# Patient Record
Sex: Female | Born: 1970 | Hispanic: No | Marital: Married | Smoking: Never smoker
Health system: Southern US, Community
[De-identification: ages and names within clinical notes are randomized; demographics above are authoritative.]

## PROBLEM LIST (undated history)

## (undated) DIAGNOSIS — M1990 Unspecified osteoarthritis, unspecified site: Secondary | ICD-10-CM

## (undated) DIAGNOSIS — E785 Hyperlipidemia, unspecified: Secondary | ICD-10-CM

## (undated) DIAGNOSIS — E559 Vitamin D deficiency, unspecified: Secondary | ICD-10-CM

## (undated) DIAGNOSIS — B977 Papillomavirus as the cause of diseases classified elsewhere: Secondary | ICD-10-CM

## (undated) HISTORY — DX: Unspecified osteoarthritis, unspecified site: M19.90

## (undated) HISTORY — PX: MOUTH SURGERY: SHX715

## (undated) HISTORY — DX: Hyperlipidemia, unspecified: E78.5

## (undated) HISTORY — DX: Vitamin D deficiency, unspecified: E55.9

## (undated) HISTORY — DX: Papillomavirus as the cause of diseases classified elsewhere: B97.7

---

## 1997-05-20 ENCOUNTER — Other Ambulatory Visit: Admission: RE | Admit: 1997-05-20 | Discharge: 1997-05-20 | Payer: Self-pay | Admitting: Obstetrics

## 1997-05-21 ENCOUNTER — Other Ambulatory Visit: Admission: RE | Admit: 1997-05-21 | Discharge: 1997-05-21 | Payer: Self-pay | Admitting: Obstetrics

## 1997-06-18 ENCOUNTER — Ambulatory Visit (HOSPITAL_COMMUNITY): Admission: RE | Admit: 1997-06-18 | Discharge: 1997-06-18 | Payer: Self-pay | Admitting: Obstetrics

## 1997-07-17 ENCOUNTER — Ambulatory Visit (HOSPITAL_COMMUNITY): Admission: RE | Admit: 1997-07-17 | Discharge: 1997-07-17 | Payer: Self-pay | Admitting: Obstetrics

## 1997-07-23 ENCOUNTER — Ambulatory Visit (HOSPITAL_COMMUNITY): Admission: RE | Admit: 1997-07-23 | Discharge: 1997-07-23 | Payer: Self-pay | Admitting: Obstetrics

## 1998-01-06 ENCOUNTER — Inpatient Hospital Stay (HOSPITAL_COMMUNITY): Admission: AD | Admit: 1998-01-06 | Discharge: 1998-01-08 | Payer: Self-pay | Admitting: Obstetrics

## 2001-02-01 ENCOUNTER — Inpatient Hospital Stay (HOSPITAL_COMMUNITY): Admission: AD | Admit: 2001-02-01 | Discharge: 2001-02-03 | Payer: Self-pay | Admitting: *Deleted

## 2001-03-15 ENCOUNTER — Other Ambulatory Visit: Admission: RE | Admit: 2001-03-15 | Discharge: 2001-03-15 | Payer: Self-pay | Admitting: Gynecology

## 2002-03-26 ENCOUNTER — Other Ambulatory Visit: Admission: RE | Admit: 2002-03-26 | Discharge: 2002-03-26 | Payer: Self-pay | Admitting: *Deleted

## 2003-10-23 ENCOUNTER — Other Ambulatory Visit: Admission: RE | Admit: 2003-10-23 | Discharge: 2003-10-23 | Payer: Self-pay | Admitting: Gynecology

## 2006-02-27 ENCOUNTER — Other Ambulatory Visit: Admission: RE | Admit: 2006-02-27 | Discharge: 2006-02-27 | Payer: Self-pay | Admitting: Gynecology

## 2006-04-03 ENCOUNTER — Ambulatory Visit (HOSPITAL_COMMUNITY): Admission: RE | Admit: 2006-04-03 | Discharge: 2006-04-03 | Payer: Self-pay | Admitting: Gynecology

## 2006-04-14 ENCOUNTER — Encounter: Admission: RE | Admit: 2006-04-14 | Discharge: 2006-04-14 | Payer: Self-pay | Admitting: Gynecology

## 2006-11-10 ENCOUNTER — Other Ambulatory Visit: Admission: RE | Admit: 2006-11-10 | Discharge: 2006-11-10 | Payer: Self-pay | Admitting: Gynecology

## 2006-12-08 HISTORY — PX: ENDOMETRIAL ABLATION: SHX621

## 2007-03-05 ENCOUNTER — Other Ambulatory Visit: Admission: RE | Admit: 2007-03-05 | Discharge: 2007-03-05 | Payer: Self-pay | Admitting: Gynecology

## 2007-04-06 ENCOUNTER — Encounter: Admission: RE | Admit: 2007-04-06 | Discharge: 2007-04-06 | Payer: Self-pay | Admitting: Gynecology

## 2008-03-05 ENCOUNTER — Ambulatory Visit: Payer: Self-pay | Admitting: Gynecology

## 2008-03-05 ENCOUNTER — Other Ambulatory Visit: Admission: RE | Admit: 2008-03-05 | Discharge: 2008-03-05 | Payer: Self-pay | Admitting: Gynecology

## 2008-03-05 ENCOUNTER — Encounter: Payer: Self-pay | Admitting: Gynecology

## 2008-03-20 ENCOUNTER — Ambulatory Visit: Payer: Self-pay | Admitting: Gynecology

## 2008-03-20 ENCOUNTER — Encounter: Payer: Self-pay | Admitting: Gynecology

## 2008-03-27 ENCOUNTER — Ambulatory Visit: Payer: Self-pay | Admitting: Gynecology

## 2008-04-07 ENCOUNTER — Encounter: Admission: RE | Admit: 2008-04-07 | Discharge: 2008-04-07 | Payer: Self-pay | Admitting: Gynecology

## 2008-10-21 ENCOUNTER — Ambulatory Visit: Payer: Self-pay | Admitting: Gynecology

## 2008-10-21 ENCOUNTER — Other Ambulatory Visit: Admission: RE | Admit: 2008-10-21 | Discharge: 2008-10-21 | Payer: Self-pay | Admitting: Gynecology

## 2008-10-21 ENCOUNTER — Encounter: Payer: Self-pay | Admitting: Gynecology

## 2008-11-05 ENCOUNTER — Ambulatory Visit: Payer: Self-pay | Admitting: Gynecology

## 2008-11-12 ENCOUNTER — Ambulatory Visit (HOSPITAL_COMMUNITY): Admission: RE | Admit: 2008-11-12 | Discharge: 2008-11-12 | Payer: Self-pay | Admitting: Gynecology

## 2008-12-23 ENCOUNTER — Ambulatory Visit (HOSPITAL_COMMUNITY): Admission: RE | Admit: 2008-12-23 | Discharge: 2008-12-23 | Payer: Self-pay | Admitting: Gastroenterology

## 2009-03-31 ENCOUNTER — Ambulatory Visit: Payer: Self-pay | Admitting: Gynecology

## 2009-03-31 ENCOUNTER — Other Ambulatory Visit: Admission: RE | Admit: 2009-03-31 | Discharge: 2009-03-31 | Payer: Self-pay | Admitting: Gynecology

## 2009-11-26 ENCOUNTER — Ambulatory Visit: Payer: Self-pay | Admitting: Women's Health

## 2010-02-14 ENCOUNTER — Encounter: Payer: Self-pay | Admitting: Gynecology

## 2010-04-02 ENCOUNTER — Encounter: Payer: Self-pay | Admitting: Gynecology

## 2010-04-20 ENCOUNTER — Other Ambulatory Visit: Payer: Self-pay | Admitting: Gynecology

## 2010-04-20 ENCOUNTER — Other Ambulatory Visit (HOSPITAL_COMMUNITY)
Admission: RE | Admit: 2010-04-20 | Discharge: 2010-04-20 | Disposition: A | Discharge status: Home / Self Care | Payer: Self-pay | Source: Ambulatory Visit | Attending: Gynecology | Admitting: Gynecology

## 2010-04-20 ENCOUNTER — Encounter (INDEPENDENT_AMBULATORY_CARE_PROVIDER_SITE_OTHER): Payer: Self-pay | Admitting: Gynecology

## 2010-04-20 DIAGNOSIS — R823 Hemoglobinuria: Secondary | ICD-10-CM

## 2010-04-20 DIAGNOSIS — B3731 Acute candidiasis of vulva and vagina: Secondary | ICD-10-CM

## 2010-04-20 DIAGNOSIS — Z1231 Encounter for screening mammogram for malignant neoplasm of breast: Secondary | ICD-10-CM

## 2010-04-20 DIAGNOSIS — Z124 Encounter for screening for malignant neoplasm of cervix: Secondary | ICD-10-CM | POA: Insufficient documentation

## 2010-04-20 DIAGNOSIS — Z13220 Encounter for screening for lipoid disorders: Secondary | ICD-10-CM

## 2010-04-20 DIAGNOSIS — Z01419 Encounter for gynecological examination (general) (routine) without abnormal findings: Secondary | ICD-10-CM

## 2010-04-20 DIAGNOSIS — B373 Candidiasis of vulva and vagina: Secondary | ICD-10-CM

## 2010-04-20 DIAGNOSIS — Z833 Family history of diabetes mellitus: Secondary | ICD-10-CM

## 2010-04-23 ENCOUNTER — Ambulatory Visit (INDEPENDENT_AMBULATORY_CARE_PROVIDER_SITE_OTHER): Payer: Self-pay | Admitting: Gynecology

## 2010-04-23 DIAGNOSIS — E7800 Pure hypercholesterolemia, unspecified: Secondary | ICD-10-CM

## 2010-04-30 ENCOUNTER — Ambulatory Visit
Admission: RE | Admit: 2010-04-30 | Discharge: 2010-04-30 | Disposition: A | Discharge status: Home / Self Care | Payer: Self-pay | Source: Ambulatory Visit | Attending: Gynecology | Admitting: Gynecology

## 2010-04-30 DIAGNOSIS — Z1231 Encounter for screening mammogram for malignant neoplasm of breast: Secondary | ICD-10-CM

## 2010-06-11 NOTE — Discharge Summary (Signed)
[facility]  Patient:    Gabrielle Cameron, Gabrielle Cameron Visit Number: [id number] MRN: [id number]          Service Type: OBS Location: 910A [id number] Attending Physician:  Wetzel Bjornstad Dictated by:   Antony Contras, [facility] Admit Date:  02/01/2001 Discharge Date: 02/03/2001                             Discharge Summary  DISCHARGE DIAGNOSES:          1. Intrauterine pregnancy at 40 weeks.                               2. Spontaneous onset of labor.                               3. Normal spontaneous vagina delivery of                                  viable infant over second degree laceration.  HISTORY OF PRESENT ILLNESS:   The patient is a 40 year old, Gravida 4, Para 3-0-0-3 with LMP April 22, 2000, [facility] January 27, 2001.  Prenatal course was uncomplicated.  LABORATORY DATA:              Blood type O positive.  Antibody screen negative.  RPR, HBICG, HIV nonreactive, Rubella immune.  HOSPITAL COURSE AND TREATMENT:                    The patient was admitted on February 01, 2001 with spontaneous onset of labor.  Cervix was 8 cm on admission.  The patient progressed rapidly to complete dilatation.  Delivered.  Apgar 8 and 9.  Female infant weighing 9 pounds 4 ounces over an intact perineum with a repair of a second degree laceration.  The patient remained afebrile and had no difficulty voiding.  Was able to discharge her on the second postoperative day in satisfactory condition.  DISPOSITION:                  Follow up in six weeks.  Continue prenatal vitamins and iron, Motrin and Tylox for pain.  DISCHARGE LABORATORY DATA:    CBC:  Hematocrit 30.6, hemoglobin 10.8, WBC 11.1, platelets 161. Dictated by:   Antony Contras, [facility] Attending Physician:  Wetzel Bjornstad DD:  02/22/01 TD:  02/23/01 Job: [id number]

## 2010-06-11 NOTE — H&P (Signed)
[facility]  Patient:    Gabrielle Cameron, Gabrielle Cameron [facility] Visit Number: [id number] MRN: [id number]          Service Type: OBS Location: 910B [id number] 01 Attending Physician:  Wetzel Bjornstad Dictated by:   Katy Fitch, M.D. Admit Date:  02/01/2001                           History and Physical  CHIEF COMPLAINT:              Contractions.  HISTORY OF PRESENT ILLNESS:   The patient is a 40 year old G4, P3 at 40 weeks and 5 days who presents to [facility] triage complaining of contractions for 4-1/2 hours.  The patient denies any rupture of membranes, vaginal bleeding, headache, nausea, vomiting, fevers, chills, or change in bowel or bladder habits.  The patient has had an uncomplicated prenatal course.  PAST MEDICAL HISTORY:         None.  PAST SURGICAL HISTORY:        None.  MEDICATIONS:                  Prenatal vitamins.  ALLERGIES:                    No known drug allergies.  SOCIAL HISTORY:               Without any tobacco, alcohol, or drugs.  FAMILY HISTORY:               Without mental retardation or epithelial cancers.  PRENATAL LABORATORY DATA:     O-positive.  Rubella immune.  GBS negative.  PHYSICAL EXAMINATION:  VITAL SIGNS:                  Blood pressure 122/70.  HEENT:                        Throat clear.  LUNGS:                        Clear to auscultation bilaterally.  HEART:                        Regular rate and rhythm.  ABDOMEN:                      Gravid, nontender.  Estimated fetal weight 8-1/2 pounds.  CERVIX:                       Eight, 100% bulging bag.  Toco q.2-3, fetal heart tones 140s with good accelerations.  PLAN:                         Will be admit to labor and delivery.  Will artificially rupture membranes.  GBS is negative, so no prophylaxis is needed ______ antibiotics. Dictated by:   Katy Fitch, M.D. Attending Physician:  Wetzel Bjornstad DD:  02/01/01 TD:  02/01/01 Job:  [id number]

## 2010-11-01 ENCOUNTER — Other Ambulatory Visit: Payer: Self-pay | Admitting: *Deleted

## 2010-11-01 ENCOUNTER — Ambulatory Visit: Payer: Self-pay | Admitting: Gynecology

## 2010-11-01 ENCOUNTER — Other Ambulatory Visit: Payer: Self-pay | Admitting: Gynecology

## 2010-11-01 DIAGNOSIS — E7800 Pure hypercholesterolemia, unspecified: Secondary | ICD-10-CM

## 2010-11-01 MED ORDER — ATORVASTATIN CALCIUM 10 MG PO TABS: 10.0000 mg | ORAL_TABLET | Freq: Every day | ORAL | Status: DC

## 2010-11-02 LAB — ALT: ALT: 11 U/L (ref 0–35)

## 2010-11-02 LAB — AST: AST: 16 U/L (ref 0–37)

## 2010-11-02 MED ORDER — ATORVASTATIN CALCIUM 10 MG PO TABS: 10.0000 mg | ORAL_TABLET | Freq: Every day | ORAL | Status: DC

## 2010-11-02 NOTE — Telephone Encounter (Signed)
Sent original prescription to wrong pharmacy.

## 2010-11-02 NOTE — Telephone Encounter (Signed)
Addended by: [place], Fleming Prill E on: 11/02/2010 01:11 PM   Modules accepted: Orders

## 2011-05-24 ENCOUNTER — Encounter: Payer: Self-pay | Admitting: Gynecology

## 2011-06-23 ENCOUNTER — Other Ambulatory Visit (HOSPITAL_COMMUNITY)
Admission: RE | Admit: 2011-06-23 | Discharge: 2011-06-23 | Disposition: A | Discharge status: Home / Self Care | Payer: Self-pay | Source: Ambulatory Visit | Attending: Gynecology | Admitting: Gynecology

## 2011-06-23 ENCOUNTER — Ambulatory Visit (INDEPENDENT_AMBULATORY_CARE_PROVIDER_SITE_OTHER): Payer: Self-pay | Admitting: Gynecology

## 2011-06-23 ENCOUNTER — Other Ambulatory Visit: Payer: Self-pay | Admitting: Gynecology

## 2011-06-23 ENCOUNTER — Encounter: Payer: Self-pay | Admitting: Gynecology

## 2011-06-23 VITALS — BP 122/80 | Ht 64.0 in | Wt 183.0 lb

## 2011-06-23 DIAGNOSIS — R87810 Cervical high risk human papillomavirus (HPV) DNA test positive: Secondary | ICD-10-CM | POA: Insufficient documentation

## 2011-06-23 DIAGNOSIS — D649 Anemia, unspecified: Secondary | ICD-10-CM

## 2011-06-23 DIAGNOSIS — E7800 Pure hypercholesterolemia, unspecified: Secondary | ICD-10-CM

## 2011-06-23 DIAGNOSIS — Z1231 Encounter for screening mammogram for malignant neoplasm of breast: Secondary | ICD-10-CM

## 2011-06-23 DIAGNOSIS — R635 Abnormal weight gain: Secondary | ICD-10-CM | POA: Insufficient documentation

## 2011-06-23 DIAGNOSIS — Z833 Family history of diabetes mellitus: Secondary | ICD-10-CM

## 2011-06-23 DIAGNOSIS — Z01419 Encounter for gynecological examination (general) (routine) without abnormal findings: Secondary | ICD-10-CM

## 2011-06-23 DIAGNOSIS — N898 Other specified noninflammatory disorders of vagina: Secondary | ICD-10-CM

## 2011-06-23 DIAGNOSIS — Z113 Encounter for screening for infections with a predominantly sexual mode of transmission: Secondary | ICD-10-CM

## 2011-06-23 LAB — CBC WITH DIFFERENTIAL/PLATELET
Basophils Absolute: 0 10*3/uL (ref 0.0–0.1)
Eosinophils Relative: 1 % (ref 0–5)
HCT: 34.2 % — ABNORMAL LOW (ref 36.0–46.0)
Lymphocytes Relative: 41 % (ref 12–46)
MCH: 24.6 pg — ABNORMAL LOW (ref 26.0–34.0)
MCHC: 32.5 g/dL (ref 30.0–36.0)
MCV: 75.7 fL — ABNORMAL LOW (ref 78.0–100.0)
Neutrophils Relative %: 49 % (ref 43–77)

## 2011-06-23 LAB — COMPREHENSIVE METABOLIC PANEL
ALT: 10 U/L (ref 0–35)
BUN: 7 mg/dL (ref 6–23)
Chloride: 106 mEq/L (ref 96–112)
Potassium: 3.8 mEq/L (ref 3.5–5.3)
Sodium: 138 mEq/L (ref 135–145)
Total Bilirubin: 0.5 mg/dL (ref 0.3–1.2)

## 2011-06-23 LAB — THYROID PANEL WITH TSH
Free Thyroxine Index: 3.3 (ref 1.0–3.9)
T3 Uptake: 35.1 % (ref 22.5–37.0)

## 2011-06-23 LAB — WET PREP FOR TRICH, YEAST, CLUE
Clue Cells Wet Prep HPF POC: NONE SEEN
Trich, Wet Prep: NONE SEEN

## 2011-06-23 LAB — LIPID PANEL
HDL: 46 mg/dL (ref >39)
LDL Cholesterol: 99 mg/dL (ref 0–99)
Triglycerides: 108 mg/dL (ref <150)

## 2011-06-23 MED ORDER — CLINDAMYCIN PHOSPHATE 2 % VA CREA: 1.0000 | TOPICAL_CREAM | Freq: Every day | VAGINAL | Status: AC

## 2011-06-23 NOTE — Progress Notes (Signed)
Gabrielle Cameron [facility] 1970-03-07 [id number]   History:    41 y.o.  for annual gyn exam with complaint of a greenish-like discharge that she noted a few weeks ago and tried over-the-counter medications and restarted again with a discharge for the past 5 days. She has informed me that this occurred after her and her spouse had anal intercourse. She denies any dysuria or frequency. Her husband has had a vasectomy. She has normal menstrual cycles. Past history of endometrial ablation. Patient with history of hypercholesterolemia had been placed on Lipitor 10 mg last year. Patient states she ran of the Lipitor 3 weeks ago and would rather not beyond medication and would like to work on diet and exercise.  Past medical history,surgical history, family history and social history were all reviewed and documented in the EPIC chart.  Gynecologic History Patient's last menstrual period was 05/30/2011. Contraception: vasectomy Last Pap: 2012. Results were: normal Last mammogram: 2012. Results were: normal  Obstetric History OB History    Grav Para Term Preterm Abortions TAB SAB Ect Mult Living   4 4 4       4      # Outc Date GA Lbr Len/2nd Wgt Sex Del Anes PTL Lv   1 TRM     F SVD  No Yes   2 TRM     F SVD  No Yes   3 TRM     F SVD  No Yes   4 TRM     F SVD  No Yes       ROS: A ROS was performed and pertinent positives and negatives are included in the history.  GENERAL: No fevers or chills. HEENT: No change in vision, no earache, sore throat or sinus congestion. NECK: No pain or stiffness. CARDIOVASCULAR: No chest pain or pressure. No palpitations. PULMONARY: No shortness of breath, cough or wheeze. GASTROINTESTINAL: No abdominal pain, nausea, vomiting or diarrhea, melena or bright red blood per rectum. GENITOURINARY: No urinary frequency, urgency, hesitancy or dysuria. MUSCULOSKELETAL: No joint or muscle pain, no back pain, no recent trauma. DERMATOLOGIC: No rash, no itching, no lesions.  ENDOCRINE: No polyuria, polydipsia, no heat or cold intolerance. No recent change in weight. HEMATOLOGICAL: No anemia or easy bruising or bleeding. NEUROLOGIC: No headache, seizures, numbness, tingling or weakness. PSYCHIATRIC: No depression, no loss of interest in normal activity or change in sleep pattern.     Exam: chaperone present  BP 122/80  Ht 5\' 4"  (1.626 m)  Wt 183 lb (83.008 kg)  BMI 31.41 kg/m2  LMP 05/30/2011  Body mass index is 31.41 kg/(m^2).  General appearance : Well developed well nourished female. No acute distress HEENT: Neck supple, trachea midline, no carotid bruits, no thyroidmegaly Lungs: Clear to auscultation, no rhonchi or wheezes, or rib retractions  Heart: Regular rate and rhythm, no murmurs or gallops Breast:Examined in sitting and supine position were symmetrical in appearance, no palpable masses or tenderness,  no skin retraction, no nipple inversion, no nipple discharge, no skin discoloration, no axillary or supraclavicular lymphadenopathy Abdomen: no palpable masses or tenderness, no rebound or guarding Extremities: no edema or skin discoloration or tenderness  Pelvic:  Bartholin, Urethra, Skene Glands: Within normal limits             Vagina: No gross lesions or discharge  Cervix: No gross lesions or discharge, friable erythematous  Uterus  anteverted, normal size, shape and consistency, non-tender and mobile  Adnexa  Without masses or tenderness  Anus and perineum  normal   Rectovaginal  normal sphincter tone without palpated masses or tenderness             Hemoccult not done   Wet prep: Too numerous to count white blood cells and many bacteria.  Assessment/Plan:  41 y.o. female for annual exam with clinical symptoms suspicious of bacterial vaginosis despite no clue cells been seen on a wet prep. Patient will be placed on Cleocin vaginal cream to apply each bedtime for 5 days. A GC and Chlamydia culture was done today along with her Pap smear. New  Pap smear screening guidelines discussed. Requisition to schedule her mammogram was provided. Patient encouraged to do her monthly self breast examination. The following labs will be drawn today: Fasting lipid profile, conference metabolic panel, TSH, CBC and urinalysis. Patient wishes to not go back on Lipitor and would like to work on diet and exercise. Literature information was provided. Would then recommend repeat lipid profile in a fasting state in 6 months.    Ok Edwards MD, 9:51 AM 06/23/2011

## 2011-06-23 NOTE — Patient Instructions (Addendum)
Control del colesterol  Los niveles de colesterol en el organismo estn determinados significativamente por su dieta. Los niveles de colesterol tambin se relacionan con la enfermedad cardaca. El material que sigue ayuda a explicar esta relacin y a analizar qu puede hacer para mantener su corazn sano. No todo el colesterol es malo. Las lipoprotenas de baja densidad (LDL) forman el colesterol "malo". El colesterol malo puede ocasionar depsitos de grasa que se acumulan en el interior de las arterias. Las lipoprotenas de alta densidad (HDL) es el colesterol "bueno". Ayuda a remover el colesterol LDL "malo" de la sangre. El colesterol es un factor de riesgo muy importante para la enfermedad cardaca. Otros factores de riesgo son la hipertensin arterial, el hbito de fumar, el estrs, la herencia y el peso.  El msculo cardaco obtiene el suministro de sangre a travs de las arterias coronarias. Si su colesterol LDL ("malo") est elevado y el HDL ("bueno") es bajo, tiene un factor de riesgo para que se formen depsitos de grasa en las arterias coronarias (los vasos sanguneos que suministran sangre al corazn). Esto hace que haya menos lugar para que la sangre circule. Sin la suficiente sangre y oxgeno, el msculo cardaco no puede funcionar correctamente, y usted podr sentir dolores en el pecho (angina pectoris). Cuando una arteria coronaria se cierra completamente, una parte del msculo cardaco puede morir (infarto de miocardio). CONTROL DEL COLESTEROL Cuando el profesional que lo asiste enva la sangre al laboratorio para conocer el nivel de colesterol, puede realizarle tambin un perfil completo de los lpidos. Con esta prueba, se puede determinar la cantidad total de colesterol, as como los niveles de LDL y HDL. Los triglicridos son un tipo de grasa que circula en la sangre y que tambin puede utilizarse para determinar el riesgo de enfermedad  cardaca. En la siguiente tabla se establecen los nmeros ideales: Prueba: Colesterol total  Menos de 200 mg/dl.  Prueba: LDL "colesterol malo"  Menos de 100 mg/dl.   Menos de 70 mg/dl si tiene riesgo muy elevado de sufrir un ataque cardaco o muerte cardaca sbita.  Prueba: HDL "colesterol bueno"  Mujeres: Ms de 50 mg/dl.   Hombres: Ms de 40 mg/dl.  Prueba: Trigliceridos  Menos de 150 mg/dl.  CONTROL DEL COLESTEROL CON DIETA Aunque factores como el ejercicio y el estilo de vida son importantes, la "primera lnea de ataque" es la dieta. Esto se debe a que se sabe que ciertos alimentos hacen subir el colesterol y otros lo bajan. El objetivo debe ser equilibrar los alimentos, de modo que tengan un efecto sobre el colesterol y, an ms importante, reemplazar las grasas saturadas y trans con otros tipos de grasas, como las monoinsaturadas y las poliinsaturadas y cidos grasos omega-3 . En promedio, una persona no debe consumir ms de 15 a 17 g de grasas saturadas por da. Las grasas saturadas y trans se consideran grasas "malas", ya que elevan el colesterol LDL. Las grasas saturadas se encuentran principalmente en productos animales como carne, manteca y crema. Pero esto no significa que usted debe sacrificar todas sus comidas favoritas. Actualmente, como lo muestra el cuadro que figura al final de este documento, hay sustitutos de buen sabor, bajos en grasas y en colesterol, para la mayora de los alimentos que a usted le gusta comer. Elija aquellos alimentos alternativos que sean bajos en grasas o sin grasas. Elija cortes de carne del cuarto trasero o lomo ya que estos cortes son los que tienen menor cantidad de grasa y colesterol. El pollo (  sin piel), el pescado, la carne de ternera, y la pechuga de pavo molida son excelentes opciones. Elimine las carnes grasosas como los hotdogs o el salami. Los mariscos tienen poco o nada de grasas saturadas. Cuando consuma carne magra, carne de aves de  corral, o pescado, hgalo en porciones de 85 gramos (3 onzas). Las grasas trans tambin se llaman "aceites parcialmente hidrogenados". Son aceites manipulados cientficamente de modo que son slidos a temperatura ambiente, tienen una larga vida y mejoran el sabor y la textura de los alimentos a los que se agregan. Las grasas trans se encuentran en la margarina, masitas, crackers y alimentos horneados.  Para hornear y cocinar, el aceite es un excelente sustituto para la mantequilla. Los aceites monoinsaturados tienen un beneficio particular, ya que se cree que disminuyen el colesterol LDL (colesterol malo) y elevan el HDL. Deber evitar los aceites tropicales saturados como el de coco y el de palma.  Recuerde, adems, que puede comer sin restricciones los grupos de alimentos que son naturalmente libres de grasas saturadas y grasas trans, entre los que se incluyen el pescado, las frutas (excepto el aguacate), verduras, frijoles, cereales (cebada, arroz, cuzcuz, trigo) y las pastas (sin salsas con crema)  IDENTIFIQUE LOS ALIMENTOS QUE DISMINUYEN EL COLESTEROL  Pueden disminuir el colesterol las fibras solubles que estn en las frutas, como las manzanas, en los vegetales como el brcoli, las patatas y las zanahorias; en las legumbres como frijoles, guisantes y lentejas; y en los cereales como la cebada. Los alimentos fortificados con fitosteroles tambin pueden disminuir el colesterol. Debe consumir al menos 2 g de estos alimentos a diario para obtener el efecto de disminucin de colesterol.  En el supermercado, lea las etiquetas de los envases para identificar los alimentos bajos en grasas saturadas, libres de grasas trans y bajos en grasas, . Elija quesos que tengan solo de 2 a 3 g de grasa saturada por onza (28,35 g). Use una margarina que no dae el corazn, libre de grasas trans o aceite parcialmente hidrogenado. Al comprar alimentos horneados (galletitas dulces y galletas) evite el aceite parcialmente  hidrogenado. Los panes y bollos debern ser de granos enteros (harina de maz o de avena entera, en lugar de "harina" o "harina enriquecida"). Compre sopas en lata que no sean cremosas, con bajo contenido de sal y sin grasas adicionadas.  TCNICAS DE PREPARACIN DE LOS ALIMENTOS  Nunca fra los alimentos en aceite abundante. Si debe frer, hgalo en poco aceite y removiendo constantemente, porque as se utilizan muy pocas grasas, o utilice un spray antiadherente. Cuando le sea posible, hierva, hornee o ase las carnes y cocine los vegetales al vapor. En vez de aderezar los vegetales con mantequilla o margarina, utilice limn y hierbas, pur de manzanas y canela (para las calabazas y batatas), yogurt y salsa descremados y aderezos para ensaladas bajos en contenido graso.  BAJO EN GRASAS SATURADAS / SUSTITUTOS BAJOS EN GRASA  Carnes / Grasas saturadas (g)  Evite: Bife, corte graso (3 oz/85 g) / 11 g   Elija: Bife, corte magro (3 oz/85 g) / 4 g   Evite: Hamburguesa (3 oz/85 g) / 7 g   Elija:  Hamburguesa magra (3 oz/85 g) / 5 g   Evite: Jamn (3 oz/85 g) / 6 g   Elija:  Jamn magro (3 oz/85 g) / 2.4 g   Evite: Pollo, con piel (3 oz/85 g), Carne oscura / 4 g   Elija:  Pollo, sin piel (3 oz/85 g), Carne oscura / 2   g   Evite: Pollo, con piel (3 oz/85 g), Carne magra / 2.5 g   Elija: Pollo, sin piel (3 oz/85 g), Carne magra / 1 g  Lcteos / Grasas saturadas (g)  Evite: Leche entera (1 taza) / 5 g   Elija: Leche con bajo contenido de grasa, 2% (1 taza) / 3 g   Elija: Leche con bajo contenido de grasa, 1% (1 taza) / 1.5 g   Elija: Leche descremada (1 taza) / 0.3 g   Evite: Queso duro (1 oz/28 g) / 6 g   Elija: Queso descremado (1 oz/28 g) / 2-3 g   Evite: Queso cottage, 4% grasa (1 taza)/ 6.5 g   Elija: Queso cottage con bajo contenido de grasa, 1% grasa (1 taza)/ 1.5 g   Evite: Helado (1 taza) / 9 g   Elija: Sorbete (1 taza) / 2.5 g   Elija: Yogurt helado sin contenido de grasa  (1 taza) / 0.3 g   Elija: Barras de fruta congeladas / vestigios   Evite: Crema batida (1 cucharada) / 3.5 g   Elija: Batidos glac sin lcteos (1 cucharada) / 1 g  Condimentos / Grasas saturadas (g)  Evite: Mayonesa (1 cucharada) / 2 g   Elija: Mayonesa con bajo contenido de grasa (1 cucharada) / 1 g   Evite: Manteca (1 cucharada) / 7 g   Elija: Margarina extra light (1 cucharada) / 1 g   Evite: Aceite de coco (1 cucharada) / 11.8 g   Elija: Aceite de oliva (1 cucharada) / 1.8 g   Elija: Aceite de maz (1 cucharada) / 1.7 g   Elija: Aceite de crtamo (1 cucharada) / 1.2 g   Elija: Aceite de girasol (1 cucharada) / 1.4 g   Elija: Aceite de soja (1 cucharada) / 2.4 g   Elija: Aceite de canola (1 cucharada) / 1 g  Document Released: 01/10/2005 Document Revised: 09/22/2010 ExitCare Patient Information 2012 [employer].  Ejercicios para perder peso (Exercise to Lose Weight) La actividad fsica y una dieta saludable ayudan a perder peso. El mdico podr sugerirle ejercicios especficos. IDEAS Y CONSEJOS PARA HACER EJERCICIOS  Elija opciones econmicas que disfrute hacer , como caminar, andar en bicicleta o los vdeos para ejercitarse.   Utilice las escaleras en lugar del ascensor.   Camine durante la hora del almuerzo.   Estacione el auto lejos del lugar de trabajo o estudio.   Concurra a un gimnasio o tome clases de gimnasia.   Comience con 5  10 minutos de actividad fsica por da. Ejercite hasta 30 minutos, 4 a 6 das por semana.   Utilice zapatos que tengan un buen soporte y ropas cmodas.   Elongue antes y despus de ejercitar.   Ejercite hasta que aumente la respiracin y el corazn palpite rpido.   Beba agua extra cuando ejercite.   No haga ejercicio hasta lastimarse, sentirse mareado o que le falte mucho el aire.  La actividad fsica puede quemar alrededor de 150 caloras.  Correr 20 cuadras en 15 minutos.   Jugar vley durante 45 a 60 minutos.     Limpiar y encerar el auto durante 45 a 60 minutos.   Jugar ftbol americano de toque.   Caminar 25 cuadras en 35 minutos.   Empujar un cochecito 20 cuadras en 30 minutos.   Jugar baloncesto durante 30 minutos.   Rastrillar hojas secas durante 30 minutos.   Andar en bicicleta 80 cuadras en 30 minutos.   Caminar 30 cuadras en   30 minutos.   Bailar durante 30 minutos.   Quitar la nieve con una pala durante 15 minutos.   Nadar vigorosamente durante 20 minutos.   Subir escaleras durante 15 minutos.   Andar en bicicleta 60 cuadras durante 15 minutos.   Arreglar el jardn entre 30 y 45 minutos.   Saltar a la soga durante 15 minutos.   Limpiar vidrios o pisos durante 45 a 60 minutos.  Document Released: 04/16/2010 Document Revised: 09/22/2010 ExitCare Patient Information 2012 [employer]. Vaginosis bacteriana (Bacterial Vaginosis) La vaginosis bacteriana es una infeccin vaginal en la que el equilibrio normal de las bacterias de la vagina se modifica. Este equilibrio normal se ve afectado por un desarrollo excesivo de ciertas bacterias. Hay diferentes tipos de bacteria que causan la vaginosis bacteriana. Es el problema vaginal ms comn en las mujeres de edad frtil. CAUSAS  La causa de este trastorno no se conoce bien. Se produce como consecuencia de un aumento o desequilibrio de las bacterias nocivas.   Algunas actividades o conductas pueden poner en peligro el equilibrio normal de las bacterias en la vagina, y aumentar el riesgo. Entre ellas:   Tener un compaero sexual o mltiples compaeros sexuales.   Las duchas vaginales   Usar un dispositivo intrauterino (DIU) como mtodo anticonceptivo.   No se conoce el papel que juega la actividad sexual en el desarrollo de una VB. Sin embargo, las mujeres que nunca tuvieron relaciones sexuales raramente se infectan.  El contagio no se produce en asientos de baos, camas, piscinas o por tocar objetos.  SNTOMAS  Flujo  vaginal grisceo.   Olor parecido al pescado con la secrecin, en especial despus de tener relaciones sexuales.   Picazn o irritacin de la vagina y la vulva.   Ardor o dolor al orinar.   Algunas mujeres no presentan ningn sntoma.  DIAGNSTICO El mdico realizar un examen vaginal para diagnosticar una vaginosis bacteriana. El mdico le indicar anlisis de laboratorio y observar las muestras del lquido vaginal en el microscopio. Buscar bacterias y clulas anormales (clulas clave), pH mayor a 4.5 y una prueba de aminas positivo, todos ellos asociados al BV.  RIESGOS Y COMPLICACIONES  Enfermedad plvica inflamatoria (EPI).   Infecciones luego de una ciruga ginecolgica.   VIH.   Virus del Herpes  TRATAMIENTO En algunos casos, la infeccin desaparece sin tratamiento. Sin embargo, todas las mujeres con sntomas de VB deben tratarse para evitar complicaciones, especialmente si se ha planificado una ciruga ginecolgica. Los compaeros varones generalmente no necesitan tratamiento. Sin embargo, puede contagiarse entre parejas femeninas, de modo que el tratamiento se realiza para evitar recurrencias.   La VB puede tratarse con medicamentos que destruyen grmenes (antibiticos). Estos se presentan en pldoras o en cremas vaginales. Tanto mujeres embarazadas como no embarazadas pueden usar ambos, pero se indican en dosis diferentes. Estos antibiticos no daan al beb.   La VB puede recurrir luego del tratamiento. Si esto ocurre, se prescribir un segundo tratamiento con antibiticos.   El tratamiento es importante en el caso de las mujeres embarazadas. Si no se trata, la VB puede causar parto prematuro, especialmente en una mujer que ha tenido un parto prematuro en el pasado. Todas las mujeres embarazadas que tienen sntomas de VB deben ser controladas y tratadas.   En los casos de recurrencia crnica, se prescribe un tratamiento con un gel vaginal dos veces por semana  INSTRUCCIONES  PARA EL CUIDADO DOMICILIARIO  Tome los medicamentos que le indic el mdico.   No mantenga relaciones   sexuales hasta completar el tratamiento.   Comunique a sus compaeros sexuales que sufre una infeccin vaginal. Ellos deben concurrir para un control mdico si tienen problemas como una urticaria leve o picazn.   Practique el sexo seguro. Use preservativos. Tenga un solo compaero sexual.  PREVENCIN Algunos pasos bsicos de prevencin pueden ayudar a reducir el riesgo de desequilibrio de las bacterias vaginales y de sufrir VB.  No mantener relaciones sexuales (abstinencia)   No utilice duchas vaginales.   Utilice todos los medicamentos que le han prescripto para el tratamiento, aunque los sntomas hayan desaparecido.   Comunique a su compaero sexual que sufre una VB. De ese modo podr tratase, si es necesario, y podr evitar una recurrencia.  SOLICITE ATENCIN MDICA SI:  Los sntomas no mejoran luego de 3 das de tratamiento.   Aumentan la secrecin, el dolor o la fiebre.  ASEGRESE QUE:   Comprende estas instrucciones.   Controlar su enfermedad.   Solicitar ayuda de inmediato si no mejora o empeora.  PARA MS INFORMACIN: Division de STD Prevention (DSTDP), Centers for Disease Control and Prevention (Centros para el control y la prevencin de enfermedades, CDC): www.cdc.gov/std American Social [employer] Association (ASHA): www.ashastd.org  Document Released: 04/19/2007 Document Revised: 12/30/2010 ExitCare Patient Information 2012 [employer]. 

## 2011-06-24 LAB — URINALYSIS W MICROSCOPIC + REFLEX CULTURE
Bilirubin Urine: NEGATIVE
Casts: NONE SEEN
Glucose, UA: NEGATIVE mg/dL
Hgb urine dipstick: NEGATIVE
Leukocytes, UA: NEGATIVE
Protein, ur: NEGATIVE mg/dL
Specific Gravity, Urine: <1.005 — ABNORMAL LOW (ref 1.005–1.030)

## 2011-06-27 NOTE — Progress Notes (Signed)
Addended by: Bertram Savin A on: 06/27/2011 11:38 AM   Modules accepted: Orders

## 2011-07-06 ENCOUNTER — Encounter: Payer: Self-pay | Admitting: Gynecology

## 2011-07-06 ENCOUNTER — Ambulatory Visit (INDEPENDENT_AMBULATORY_CARE_PROVIDER_SITE_OTHER): Payer: Self-pay | Admitting: Gynecology

## 2011-07-06 VITALS — BP 118/76

## 2011-07-06 DIAGNOSIS — N870 Mild cervical dysplasia: Secondary | ICD-10-CM | POA: Insufficient documentation

## 2011-07-06 MED ORDER — CLINDAMYCIN PHOSPHATE 2 % VA CREA: 1.0000 | TOPICAL_CREAM | Freq: Every day | VAGINAL | Status: AC

## 2011-07-06 MED ORDER — FLUCONAZOLE 100 MG PO TABS: 100.0000 mg | ORAL_TABLET | Freq: Every day | ORAL | Status: AC

## 2011-07-06 NOTE — Progress Notes (Signed)
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 She was scheduled for colposcopic evaluation today. She was recently treated for bacterial vaginosis and last night completed her treatment. 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 During the colposcopic evaluation her cervix appeared to be still friable and had cream from the Cleocin she applied last night. We are going to prescribe Cleocin vaginal cream to apply for 5 more nights. She will return back in 2 weeks when she gets back from [place] to complete the colposcopic evaluation.

## 2011-07-15 ENCOUNTER — Ambulatory Visit
Admission: RE | Admit: 2011-07-15 | Discharge: 2011-07-15 | Disposition: A | Discharge status: Home / Self Care | Payer: Self-pay | Source: Ambulatory Visit | Attending: Gynecology | Admitting: Gynecology

## 2011-07-15 DIAGNOSIS — Z1231 Encounter for screening mammogram for malignant neoplasm of breast: Secondary | ICD-10-CM

## 2011-08-08 ENCOUNTER — Ambulatory Visit (INDEPENDENT_AMBULATORY_CARE_PROVIDER_SITE_OTHER): Payer: Self-pay | Admitting: Gynecology

## 2011-08-08 ENCOUNTER — Encounter: Payer: Self-pay | Admitting: Gynecology

## 2011-08-08 VITALS — BP 120/80

## 2011-08-08 DIAGNOSIS — N888 Other specified noninflammatory disorders of cervix uteri: Secondary | ICD-10-CM

## 2011-08-08 DIAGNOSIS — N870 Mild cervical dysplasia: Secondary | ICD-10-CM

## 2011-08-08 DIAGNOSIS — R3915 Urgency of urination: Secondary | ICD-10-CM

## 2011-08-08 LAB — URINALYSIS W MICROSCOPIC + REFLEX CULTURE
Bilirubin Urine: NEGATIVE
Leukocytes, UA: NEGATIVE
Nitrite: NEGATIVE
Protein, ur: NEGATIVE mg/dL
Urobilinogen, UA: 0.2 mg/dL (ref 0.0–1.0)

## 2011-08-08 MED ORDER — METRONIDAZOLE 500 MG PO TABS: 500.0000 mg | ORAL_TABLET | Freq: Two times a day (BID) | ORAL | Status: DC

## 2011-08-08 NOTE — Progress Notes (Signed)
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 She was scheduled for colposcopic evaluation today. She was recently treated for bacterial vaginosis.  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 After the speculum was introduced into the vaginal vault acetic acid was applied. A systematic inspection of the vaginal walls and vaginal fornices were inspected and no lesions were seen. Her ectocervix was friable on contact a flat acetowhite area were noted as delineated on the picture above and  respectively biopsied. Endocervical speculum was utilized and the transformation zone was visualized completely and a vigorous ECC was obtained as well. Silver nitrate and Monsel solution was used for hemostasis. Will notify the patient with the results and manage accordingly. Literature information was provided in [place].

## 2011-08-08 NOTE — Patient Instructions (Addendum)
Colposcopa (Colposcopy)  La colposcopa es un procedimiento en el que se utiliza un microscopio luminoso especial (colposcopio). Sirve para examinar el cuello del tero y la vagina o la zona externa de la vagina, para buscar signos de enfermedad o anormalidades en las clulas. La derivarn a un especialista (gineclogo) para realizar la colposcopa. Durante el procedimiento podrn tomarle una biopsia (muestra de tejido), en caso que el profesional encuentre clulas anormales. La biopsia se enva al laboratorio para un anlisis completo y los resultados sern informados a su mdico. UNA MUJER PUEDE NECESITAR ESTE PROCEDIMIENTO SI:  Tiene un papanicolau anormal (muestra de clulas del cuello del tero).   Tiene una llaga en el cuello del tero y el papanicolau es normal).   El papanicolau indica la presencia del virus del papiloma humano (VPH). El virus puede producir verrugas genitales y est relacionado con el desarrollo de cncer cervical.   Se han observado verrugas genitales en el cuello del tero o en la zona externa de la vagina.   La madre de la paciente recibi el frmaco DES durante el embarazo.   Siente dolor durante las relaciones sexuales.   Sufre hemorragias vaginales, especialmente despus de mantener relaciones sexuales.   Es necesario evaluar los resultados de un tratamiento previo.  ANTES DEL PROCEDIMIENTO  La colposcopa se realiza cuando no tenga el perodo menstrual.   Durante las 24 horas previas a la colposcopa no debe:   Realizar duchas vaginales.   Usar tampones.   Aplicarse medicamentos, cremas o supositorios en la vagina.   Tener relaciones sexuales.  PROCEDIMIENTO  La colposcopa se realiza estando la paciente recostada sobre su espalda, con los pies en los estribos de la camilla.   Dentro de la vagina se coloca el espculo para mantenerla abierta y permitir al profesional la observacin del cuello del tero. Este es el mismo instrumento que se utiliza  en el examen Papanicolau.   El colposcopio se coloca fuera de la vagina. Este instrumento se utiliza para ampliar y examinar el cuello del tero, la vagina y la zona externa de la misma.   Se aplica una pequea cantidad de solucin lquida en la zona que se va a observar. La solucin se coloca con un aplicador de algodn. Este lquido facilita la observacin de clulas anormales.   El mdico aspirar la mucosidad y las clulas del canal del cuello del tero.   En ese momento podr tomar pequeas muestras de tejido para realizar una biopsia. Cuando se toma la biopsia podr sentir un pequeo dolor o molestia.   El mdico registrar la ubicacin de las zonas anormales y enviar las muestras de tejido al laboratorio para que sean analizadas.   Cuando el mdico realiza una biopsia de la vagina o de la zona externa, aplicar un anestsico local (novocana).  DESPUS DEL PROCEDIMIENTO  Podr sentir algunos clicos que desaparecern en algunos minutos. Sentir molestias durante algunos das.   Puede usar medicamentos de venta libre segn la indicacin de su mdico. No tome aspirina, ya que puede causar hemorragias.   Recustese algunos minutos si se siente mareada.   Podr tener un sangrado leve o una secrecin oscura que debe detenerse en algunos das.   Puede ser necesario que use apsitos durante algunos das.  INSTRUCCIONES PARA EL CUIDADO DOMICILIARIO  Evite las relaciones sexuales, las duchas vaginales y el uso de tampones durante algunas semanas, o segn las indicaciones del mdico.   Tome slo la medicacin que le indic el profesional que la asiste.     Si utiliza pldoras anticonceptivas, contine tomndolas.   Durante su visita no contar con todos los resultados de los anlisis. En este caso, tenga otra entrevista con su mdico para conocerlos. No piense que el resultado es normal si no tiene noticias de su mdico o de la institucin mdica. Es importante el seguimiento de todos los  resultados de los anlisis.   Siga los consejos de su mdico con respecto a los medicamentos, actividades, visitas y Papanicolau de control.  SOLICITE ATENCIN MDICA SI:  Aparece una erupcin cutnea.   Tiene problemas con los medicamentos.  SOLICITE AYUDA MDICA DE INMEDIATO SI:  Tiene una hemorragia abundante o elimina cogulos.   La fiebre le sube a ms de 102 F (38.9 C), con o sin escalofros.   Observa flujo vaginal anormal.   Tiene clicos que no se alivian luego de tomar analgsicos.   Se siente mareada o sufre un desmayo.   Siente dolor en el estmago.  Document Released: 01/10/2005 Document Revised: 12/30/2010 [employer] Patient Information 2012 [employer]. 

## 2011-08-10 ENCOUNTER — Other Ambulatory Visit: Payer: Self-pay | Admitting: Anesthesiology

## 2011-08-10 ENCOUNTER — Telehealth: Payer: Self-pay | Admitting: Anesthesiology

## 2011-08-10 DIAGNOSIS — N888 Other specified noninflammatory disorders of cervix uteri: Secondary | ICD-10-CM

## 2011-08-10 MED ORDER — METRONIDAZOLE 500 MG PO TABS: 500.0000 mg | ORAL_TABLET | Freq: Two times a day (BID) | ORAL | Status: AC

## 2011-08-10 MED ORDER — NITROFURANTOIN MONOHYD MACRO 100 MG PO CAPS: 100.0000 mg | ORAL_CAPSULE | Freq: Two times a day (BID) | ORAL | Status: AC

## 2011-08-10 NOTE — Telephone Encounter (Signed)
Patient called requesting that her Rx for Flagyl be sent to a different pharmacy ... Rx was sent to [facility] on [address]Marland Kitchen

## 2011-08-11 LAB — URINE CULTURE: Colony Count: 15000

## 2011-09-19 ENCOUNTER — Telehealth: Payer: Self-pay | Admitting: Anesthesiology

## 2011-09-19 NOTE — Telephone Encounter (Signed)
Please call in a prescription for fluconazole 100 mg by mouth one every other day for 2 days #2. She can by Monistat over-the-counter to apply intravaginally for 3-5 nights.

## 2011-09-19 NOTE — Telephone Encounter (Signed)
Dr. Lily Peer, Gabrielle Cameron called requesting a Rx for a BV infection, she is having vaginal odor and greenish discharge.. She is not able to come and see you due to $$ issues.. Please advise.Marland KitchenMarland Kitchen

## 2011-09-20 ENCOUNTER — Telehealth: Payer: Self-pay | Admitting: *Deleted

## 2011-09-20 MED ORDER — FLUCONAZOLE 100 MG PO TABS: ORAL_TABLET | ORAL | Status: DC

## 2011-09-20 NOTE — Telephone Encounter (Signed)
Patient has been informed of message below.. Fluconazole 100mg  was called in to pharmacy.Marland KitchenMarland Kitchen

## 2011-09-20 NOTE — Telephone Encounter (Signed)
Pharmacy called to clarify rx for diflucan, rx sent.

## 2012-07-03 ENCOUNTER — Other Ambulatory Visit: Payer: Self-pay

## 2012-07-03 DIAGNOSIS — Z1231 Encounter for screening mammogram for malignant neoplasm of breast: Secondary | ICD-10-CM

## 2012-07-04 ENCOUNTER — Encounter: Payer: Self-pay | Admitting: Gynecology

## 2012-07-04 ENCOUNTER — Other Ambulatory Visit (HOSPITAL_COMMUNITY)
Admission: RE | Admit: 2012-07-04 | Discharge: 2012-07-04 | Disposition: A | Discharge status: Home / Self Care | Payer: Self-pay | Source: Ambulatory Visit | Attending: Gynecology | Admitting: Gynecology

## 2012-07-04 ENCOUNTER — Ambulatory Visit (INDEPENDENT_AMBULATORY_CARE_PROVIDER_SITE_OTHER): Payer: Self-pay | Admitting: Gynecology

## 2012-07-04 VITALS — BP 144/88 | Ht 64.0 in | Wt 185.0 lb

## 2012-07-04 DIAGNOSIS — E785 Hyperlipidemia, unspecified: Secondary | ICD-10-CM

## 2012-07-04 DIAGNOSIS — Z01419 Encounter for gynecological examination (general) (routine) without abnormal findings: Secondary | ICD-10-CM

## 2012-07-04 DIAGNOSIS — Z23 Encounter for immunization: Secondary | ICD-10-CM

## 2012-07-04 DIAGNOSIS — Z87410 Personal history of cervical dysplasia: Secondary | ICD-10-CM

## 2012-07-04 DIAGNOSIS — R635 Abnormal weight gain: Secondary | ICD-10-CM

## 2012-07-04 DIAGNOSIS — N898 Other specified noninflammatory disorders of vagina: Secondary | ICD-10-CM

## 2012-07-04 LAB — CBC WITH DIFFERENTIAL/PLATELET
Basophils Absolute: 0 10*3/uL (ref 0.0–0.1)
HCT: 32.4 % — ABNORMAL LOW (ref 36.0–46.0)
Lymphocytes Relative: 39 % (ref 12–46)
Monocytes Absolute: 0.5 10*3/uL (ref 0.1–1.0)
Neutro Abs: 3.1 10*3/uL (ref 1.7–7.7)
Platelets: 297 10*3/uL (ref 150–400)
RDW: 16.2 % — ABNORMAL HIGH (ref 11.5–15.5)
WBC: 6 10*3/uL (ref 4.0–10.5)

## 2012-07-04 LAB — WET PREP FOR TRICH, YEAST, CLUE: WBC, Wet Prep HPF POC: NONE SEEN

## 2012-07-04 NOTE — Progress Notes (Signed)
Gabrielle Cameron [facility] Nov 16, 1970 [id number]   History:    42 y.o.  for annual gyn exam who is complaining of what appears to be a right tennis elbow. She denies any injury. Patient with history of hyperlipidemia and had been on Lipitor she stopped medications last year. She has fasting today. Her husband has had a vasectomy. 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 SEE COMMENT. - BENIGN ENDOCERVICAL MUCOSA. - SCANT FRAGMENTS OF BENIGN PROLIFERATIVE PHASE ENDOMETRIUM. 2. Cervix, biopsy, 4 o'clock - LOW GRADE SQUAMOUS INTRAEPITHELIAL LESION, CIN-I (MILD DYSPLASIA). SEE COMMENT. 3. Cervix, biopsy, 12 o'clock - LOW GRADE SQUAMOUS INTRAEPITHELIAL LESION, CIN-I (MILD DYSPLASIA). SEE COMMENT. Microscopic Comment 1. 2, and 3: The findings correlate with the previous Pap test results ([id number]).  Patient with strong family history of diabetes. Patient has continued to gain weight. Patient has not received the Tdap vaccine.  Past medical history,surgical history, family history and social history were all reviewed and documented in the EPIC chart.  Gynecologic History Patient's last menstrual period was 06/08/2012. Contraception: vasectomy Last Pap: see above. Results were: see above Last mammogram: 2013. Results were: normal  Obstetric History OB History   Grav Para Term Preterm Abortions TAB SAB Ect Mult Living   4 4 4       4      # Outc Date GA Lbr Len/2nd Wgt Sex Del Anes  PTL Lv   1 TRM     F SVD  No Yes   2 TRM     F SVD  No Yes   3 TRM     F SVD  No Yes   4 TRM     F SVD  No Yes       ROS: A ROS was performed and pertinent positives and negatives are included in the history.  GENERAL: No fevers or chills. HEENT: No change in vision, no earache, sore throat or sinus congestion. NECK: No pain or stiffness. CARDIOVASCULAR: No chest pain or pressure. No palpitations. PULMONARY: No shortness of breath, cough or wheeze. GASTROINTESTINAL: No abdominal pain, nausea, vomiting or diarrhea, melena or bright red blood per rectum. GENITOURINARY: No urinary frequency, urgency, hesitancy or dysuria. MUSCULOSKELETAL: right elbow tendinitis DERMATOLOGIC: No rash, no itching, no lesions. ENDOCRINE: No polyuria, polydipsia, no heat or cold intolerance. No recent change in weight. HEMATOLOGICAL: No anemia or easy bruising or bleeding. NEUROLOGIC: No headache, seizures, numbness, tingling or weakness. PSYCHIATRIC: No depression, no loss of interest in normal activity or change in sleep pattern.     Exam: chaperone present  BP 144/88  Ht 5\' 4"  (1.626 m)  Wt 185 lb (83.915 kg)  BMI 31.74 kg/m2  LMP 06/08/2012  Body mass index is 31.74 kg/(m^2).  General appearance : Well developed well nourished female. No acute distress HEENT: Neck supple, trachea midline, no carotid bruits, no thyroidmegaly Lungs: Clear to auscultation, no rhonchi or wheezes, or rib retractions  Heart: Regular rate and rhythm, no murmurs or gallops Breast:Examined in sitting and supine position were symmetrical in appearance,  no palpable masses or tenderness,  no skin retraction, no nipple inversion, no nipple discharge, no skin discoloration, no axillary or supraclavicular lymphadenopathy Abdomen: no palpable masses or tenderness, no rebound or guarding Extremities: right elbow tendinitis Pelvic:  Bartholin, Urethra, Skene Glands: Within normal limits             Vagina: No gross lesions or  discharge  Cervix: No gross lesions or discharge  Uterus  anteverted, normal size, shape and consistency, non-tender and mobile  Adnexa  Without masses or tenderness  Anus and perineum  normal   Rectovaginal  normal sphincter tone without palpated masses or tenderness             Hemoccult none indicated     Assessment/Plan:  42 y.o. female for annual exam with right elbow tendinitis. She will use nonsteroidal and applied heat to the area. We discussed some exercises to alleviate the symptoms. Did not improve she'll be referred to the orthopedics. Patient with history of dysplasia as described above. If her Pap smear cut back with dysplasia again we discussed following up with treatment such as with cryotherapy. Patient with history of hyperlipidemia stop her Lipitor last year will check a fasting lipid profile today along with a conference metabolic panel, CBC, urinalysis and her Pap smear. She stayed a few days ago she has some slight discharge and a wet prep today was negative she is in a monogamous relationship. She is in the process getting her mammogram the next few weeks. She was reminded do monthly self breast examination. We discussed the importance of calcium and vitamin D for osteoporosis prevention. All the above was discussed in [place].    Ok Edwards MD, 10:21 AM 07/04/2012

## 2012-07-04 NOTE — Patient Instructions (Signed)
Vacuna difteria, tétanos, tos ferina (DTP) - Lo que debe saber   (Tetanus, Diphtheria, Pertussis [Tdap] Vaccine, What You Need to Know)  ¿PORQUÉ VACUNARSE?   El tétanos, la difteria y la tos ferina pueden ser enfermedades muy graves, aún en adolescentes y adultos. La vacuna Tdap nos puede proteger de estas enfermedades.   El TÉTANOS (Trismo) provoca la contracción dolorosa de los músculos, por lo general, en todo el cuerpo.   · Puede causar el endurecimiento de los músculos de la cabeza y el cuello, de modo que impide abrir la boca, tragar y en algunos casos, respirar. El tétanos causa la muerte de 1 de cada 5 personas que se infectan.  La DIFTERIA produce la formación de una membrana gruesa que cubre el fondo de la garganta.   · Puede causar problemas respiratorios, parálisis, insuficiencia cardíaca e incluso la muerte.  TOS FERINA (Pertusis) causa episodios de tos graves, que pueden hacer difícil la respiración, causar vómitos y trastornos del sueño.   · También puede ser la causa de pérdida de peso, incontinencia y fractura de costillas. Dos de cada 100 adolescentes y cinco de cada 100 adultos que enferman de pertusis deben ser hospitalizados, tienen complicaciones como la neumonía o mueren.  Estas enfermedades son provocadas por bacterias. La difteria y el pertusis se contagian de persona a persona a través de la tos o el estornudo. El tétanos ingresa al organismo a través de cortes, rasguños o heridas.   Antes de las vacunas, en los [place] se vieron más de 200.000 casos al año de difteria y tos ferina y cientos de casos de tétanos. Desde el inicio de la vacunación, los casos de tétanos y difteria han disminuido alrededor del 99% y los casos de tos ferina alrededor del 80%.   Tdap   La vacuna Tdap protege a adolescentes y adultos contra el tétanos, la difteria y la tos ferina. Una dosis de Tdap se administra a los 11 o 12 años de edad. Las personas que no recibieron la vacuna Tdap a esa edad deben  recibirla tan pronto como sea posible.   Es muy importante que los profesionales de la salud y todos aquellos que tengan contacto cercano con bebés menores de 12 meses reciban la Tdap.   Las mujeres embarazadas deben recibir una dosis de Tdap en cada embarazo, para proteger al recién nacido de la tos ferina. Los niños tienen mayor riesgo de complicaciones graves y potencialmente mortales debido a la tos ferina.   Una vacuna similar, llamada Td, protege contra el tétanos y la difteria, pero no contra la tos ferina. Cada 10 años debe recibirse un refuerzo de Td. La Tdap se puede administrar como uno de estos refuerzos, si todavía no ha recibido una dosis. También se puede aplicar después de un corte o quemadura grave para prevenir la infección por tétanos.   El médico le dará más información.   La Tdap puede administrarse de manera segura simultáneamente con otras vacunas.   ALGUNAS PERSONAS NO DEBEN RECIBIR ESTA VACUNA.   · Si alguna vez tuvo una reacción alérgica potencialmente mortal después de una dosis de la vacuna contra el tétanos, la diferia o la tos ferina, o tuvo una alergia grave a cualquiera de los componentes de esta vacuna, no debe aplicarse la vacuna. Informe a su médico si usted sufre algún tipo de alergia grave.  · Si estuvo en coma o sufrió múltiples convulsiones dentro de los 7 días posteriores después de una dosis de DTP o DTaP   su mdico si:  tiene epilepsia u otra enfermedad del sistema nervioso,  siente dolor intenso o se hincha despus de recibir cualquier vacuna contra la difteria, el ttanos o la tos ferina,  alguna vez ha sufrido el sndrome de Guillain-Barr,  no se siente bien el da en que se ha programado la vacuna. RIESGOS DE UNA REACCIN A LA VACUNA Con cualquier medicamento, incluyendo las vacunas, existe la posibilidad de que aparezcan efectos secundarios. Estos son  leves y desaparecen por s solos, pero tambin son posibles las reacciones graves.  Breves episodios de desmayo pueden seguir a una vacunacin, causando lesiones por la cada. Sentarse o recostarse durante 15 minutos puede ayudar a evitarlo. Informe al mdico si se siente mareado o aturdido, tiene cambios en la visin o zumbidos en los odos.  Problemas leves luego de la Tdap (no interferirn con las actividades)   Dolor en el sitio de la inyeccin (alrededor de 1 de cada 4 adolescentes o 2 de cada 3 adultos).  Enrojecimiento o hinchazn en el lugar de la inyeccin (1 de cada 5 personas).  Fiebre leve de al menos 100,4 F (38 C) (hasta alrededor de 1 cada 25 adolescentes y 1 de cada 100 adultos).  Dolor de cabeza (3 o 4de cada 10 personas).  Cansancio (1 de cada 3 o 4 personas).  Nuseas, vmitos, diarrea, dolor de estmago (1 de cada 4 adolescentes o 1 de cada 10 adultos).  Escalofros, dolores corporales, dolor articular, erupciones, inflamacin de las glndulas (poco frecuente). Problemas moderados: (interfieren con las actividades, pero no requieren atencin mdica)   Dolor en el lugar de la inyeccin (1 de cada 5 adolescentes o 1 de cada 100 adultos).  Enrojecimiento o inflamacin (1 de cada 16 adolescentes y 1 de cada 25 adultos).  Fiebre de ms de 102F o 38,9C (1 de cada 100 adolescentes o 1 de cada 250 adultos).  Dolor de cabeza (alrededor de 4 de cada 20 adolescentes y 3 de cada 10 adultos).  Nuseas, vmitos, diarrea, dolor de estmago (1 a 3 de cada 100 personas).  Hinchazn de todo el brazo en el que se aplic la vacuna (3 de cada100 personas). Problemas graves: luego de la Tdap (no puede realizar las actividades habituales, requiere atencin mdica)   Inflamacin, dolor intenso, sangrado y enrojecimiento en el brazo, en el sitio de la inyeccin (poco frecuente). Una reaccin alrgica grave puede ocurrir despus de la administracin de cualquier vacuna (se estima en  menos de 1 en un milln de dosis).  QU PASA SI HAY UNA REACCIN GRAVE?  Qu signos debo buscar?  Observe todo lo que le preocupe, como signos de una reaccin alrgica grave, fiebre muy alta o cambios en el comportamiento. Los signos de una reaccin alrgica grave pueden incluir urticaria, hinchazn de la cara y la garganta, dificultad para respirar, ritmo cardaco acelerado, mareos y debilidad. Estos sntomas pueden comenzar entre unos pocos minutos y algunas horas despus de la vacunacin.  Qu debo hacer?  Si usted piensa que se trata de una reaccin alrgica grave o de otra emergencia que no puede esperar, llame al 911 o lleve a la persona al hospital ms cercano. De lo contrario, llame a su mdico.  Despus, la reaccin debe informarse a la "Vaccine Adverse Event Reporting System" (Sistema de informacin sobre efectos adversos de las vacunas -VAERS). El mdico o usted mismo pueden realizar el informe en el sitio web del VAERS www.vaers.hhs.govo llame al 1-800-822-7967. El VAERS es slo para informar reacciones. No   brindan consejo mdico.  PROGRAMA NACIONAL DE COMPENSACIN DE DAOS POR VACUNAS  El National Vaccine Injury [employer] (VICP) es un programa federal que fue creado para compensar a las personas que puedan haber sufrido daos al recibir ciertas vacunas.  Aquellas personas que consideren que han sufrido un dao como consecuencia de una vacuna y quieren saber ms acerca del programa y como presentar Gabrielle Cameron, pueden llamar [phone] o visite el sitio web del VICP en SpiritualWord.[email or web address].  CMO PUEDO OBTENER MS INFORMACIN?   Consulte a su mdico.  Comunquese con el servicio de salud de su localidad o [address].  Comunquese con los Centros para el control y la prevencin de Child psychotherapist for Disease Control and Prevention , CDC).  llamando al [phone] o visitando el sitio web del CDC en [email or web address]. CDC Tdap Vaccine VIS  (06/02/11)  Document Released: 12/28/2011 [facility] Patient Information 2014 [place], [place]. Tendinitis (Tendinitis) La tendinitis es la hinchazn e irritacin (inflamacin) de los tejidos denominados tendones. Los tendones son estructuras similares a cuerdas que [employer] al [employer]. Ocurre con ms frecuencia en:   Los hombros (manguito rotador).  Talones (tendn de [place]).  Codos (tendn del trceps). CAUSAS La tendinitis generalmente es causada por el uso excesivo del tendn, los [place], y las Lobbyist. Cuando el tejido que rodea al tendn (la sinovia) se inflama, se denomina tenosinovitis. La tendinitis se presenta comnmente en personas cuyos trabajos requieren movimientos repetitivos. SNTOMAS   Dolor.  Sensibilidad.  Inflamacin leve. DIAGNSTICO La tendinitis normalmente se diagnostica con un examen fsico. El mdico tambin podr solicitar radiografas u otros exmenes imagenolgicos.  TRATAMIENTO El mdico podr recomendar determinados medicamentos o ejercicios para Scientist, research (medical).  INSTRUCCIONES PARA EL CUIDADO DOMICILIARIO  Utilice un cabestrillo o una tablilla durante el tiempo que se lo indique su mdico hasta que el dolor disminuya.  Aplique hielo sobre la zona lesionada.  Ponga el hielo en una bolsa plstica.  Colquese una toalla entre la piel y la bolsa de hielo.  Deje el hielo durante 15 a 20 minutos, 3 a 4 veces por da.  Evite utilizar la extremidad [employer] en el tendn. Haga ejercicios suaves con amplitud de movimientos, segn las indicaciones del profesional. Suspenda los ejercicios si el dolor o las molestias [place], a menos que el profesional le indique otra cosa.  Utilice los medicamentos de venta libre o de prescripcin para Chief Technology Officer, Environmental health practitioner o la [place], segn se lo indique el profesional que lo asiste. SOLICITE ATENCIN MDICA SI:  El dolor y la hinchazn [place].  Desarrolla sntomas nuevos y sin  causa aparente, especialmente adormecimiento de las manos. EST SEGURO QUE:   Comprende las instrucciones para el alta mdica.  Controlar su enfermedad.  Solicitar atencin mdica de inmediato segn las indicaciones. Document Released: 10/20/2004 Document Revised: 04/04/2011 [facility] Patient Information 2014 [place], [place].

## 2012-07-05 ENCOUNTER — Other Ambulatory Visit: Payer: Self-pay | Admitting: Gynecology

## 2012-07-05 LAB — URINALYSIS W MICROSCOPIC + REFLEX CULTURE
Leukocytes, UA: NEGATIVE
Nitrite: NEGATIVE
Protein, ur: NEGATIVE mg/dL
Squamous Epithelial / LPF: NONE SEEN
Urobilinogen, UA: 0.2 mg/dL (ref 0.0–1.0)

## 2012-07-05 LAB — COMPREHENSIVE METABOLIC PANEL
ALT: 11 U/L (ref 0–35)
CO2: 23 mEq/L (ref 19–32)
Calcium: 9.3 mg/dL (ref 8.4–10.5)
Chloride: 105 mEq/L (ref 96–112)
Creat: 0.59 mg/dL (ref 0.50–1.10)
Glucose, Bld: 92 mg/dL (ref 70–99)

## 2012-07-05 LAB — LIPID PANEL
HDL: 48 mg/dL (ref >39)
LDL Cholesterol: 116 mg/dL — ABNORMAL HIGH (ref 0–99)
Triglycerides: 178 mg/dL — ABNORMAL HIGH (ref <150)
VLDL: 36 mg/dL (ref 0–40)

## 2012-07-06 ENCOUNTER — Other Ambulatory Visit: Payer: Self-pay | Admitting: Gynecology

## 2012-07-10 ENCOUNTER — Other Ambulatory Visit: Payer: Self-pay | Admitting: Gynecology

## 2012-07-10 DIAGNOSIS — D649 Anemia, unspecified: Secondary | ICD-10-CM

## 2012-07-26 ENCOUNTER — Ambulatory Visit
Admission: RE | Admit: 2012-07-26 | Discharge: 2012-07-26 | Disposition: A | Discharge status: Home / Self Care | Payer: No Typology Code available for payment source | Source: Ambulatory Visit

## 2012-07-26 DIAGNOSIS — Z1231 Encounter for screening mammogram for malignant neoplasm of breast: Secondary | ICD-10-CM

## 2012-12-11 ENCOUNTER — Encounter: Payer: Self-pay | Admitting: Gynecology

## 2012-12-11 ENCOUNTER — Ambulatory Visit (INDEPENDENT_AMBULATORY_CARE_PROVIDER_SITE_OTHER): Payer: Self-pay | Admitting: Gynecology

## 2012-12-11 VITALS — BP 124/80

## 2012-12-11 DIAGNOSIS — N898 Other specified noninflammatory disorders of vagina: Secondary | ICD-10-CM

## 2012-12-11 DIAGNOSIS — Z113 Encounter for screening for infections with a predominantly sexual mode of transmission: Secondary | ICD-10-CM

## 2012-12-11 LAB — WET PREP FOR TRICH, YEAST, CLUE: Yeast Wet Prep HPF POC: NONE SEEN

## 2012-12-11 NOTE — Progress Notes (Signed)
Patient presented to the office today stating that after intercourse she notices a vaginal discharge with minimal pruritus and sometimes maybe clumpy and greenish. She states she is in a monogamous relationship. Her husband has had a vasectomy and her cycles are regular.  Exam: Pelvic: And reducing is within normal limits Vagina: No lesions or discharge Cervix: No lesions or discharge uterus: Anteverted normal size shape and consistency Adnexa: Without mass or tenderness Rectal exam: Not done  The patient stated that several weeks ago she has some mild left lower quadrant discomfort but there is no CVA tenderness today the patient was in no acute distress.  Wet prep: Negative GC and Chlamydia culture obtained results pending at time of this dictation  Assessment/plan: Nonspecific leukorrhea? Will recommend probiotic gel to use after intercourse and after menstrual cycle such as Rephresh or Luvena.we'll wait for the results of the GC and chlamydia culture. Patient with recent annual exam and normal Pap smear.

## 2012-12-12 LAB — GC/CHLAMYDIA PROBE AMP
CT Probe RNA: NEGATIVE
GC Probe RNA: NEGATIVE

## 2013-03-08 ENCOUNTER — Other Ambulatory Visit: Payer: Self-pay

## 2013-03-08 DIAGNOSIS — D649 Anemia, unspecified: Secondary | ICD-10-CM

## 2013-03-08 LAB — LIPID PANEL
CHOL/HDL RATIO: 4.3 ratio
Cholesterol: 200 mg/dL (ref 0–200)
HDL: 47 mg/dL (ref >39)
LDL Cholesterol: 112 mg/dL — ABNORMAL HIGH (ref 0–99)
Triglycerides: 206 mg/dL — ABNORMAL HIGH (ref <150)
VLDL: 41 mg/dL — AB (ref 0–40)

## 2013-03-13 ENCOUNTER — Other Ambulatory Visit: Payer: Self-pay | Admitting: Gynecology

## 2013-03-13 MED ORDER — ATORVASTATIN CALCIUM 10 MG PO TABS: 10.0000 mg | ORAL_TABLET | Freq: Every day | ORAL | Status: DC

## 2013-03-13 NOTE — Progress Notes (Signed)
Please call and prescription for Lipitor 10 mg 1 by mouth daily #30 with 11 refills

## 2013-07-08 ENCOUNTER — Encounter: Payer: Self-pay | Admitting: Gynecology

## 2013-07-08 ENCOUNTER — Other Ambulatory Visit (HOSPITAL_COMMUNITY)
Admission: RE | Admit: 2013-07-08 | Discharge: 2013-07-08 | Disposition: A | Discharge status: Home / Self Care | Payer: Self-pay | Source: Ambulatory Visit | Attending: Gynecology | Admitting: Gynecology

## 2013-07-08 ENCOUNTER — Ambulatory Visit (INDEPENDENT_AMBULATORY_CARE_PROVIDER_SITE_OTHER): Payer: Self-pay | Admitting: Gynecology

## 2013-07-08 VITALS — BP 126/82 | Ht 63.0 in | Wt 203.0 lb

## 2013-07-08 DIAGNOSIS — R635 Abnormal weight gain: Secondary | ICD-10-CM

## 2013-07-08 DIAGNOSIS — D649 Anemia, unspecified: Secondary | ICD-10-CM

## 2013-07-08 DIAGNOSIS — Z1151 Encounter for screening for human papillomavirus (HPV): Secondary | ICD-10-CM | POA: Insufficient documentation

## 2013-07-08 DIAGNOSIS — N946 Dysmenorrhea, unspecified: Secondary | ICD-10-CM | POA: Insufficient documentation

## 2013-07-08 DIAGNOSIS — Z124 Encounter for screening for malignant neoplasm of cervix: Secondary | ICD-10-CM | POA: Insufficient documentation

## 2013-07-08 DIAGNOSIS — N920 Excessive and frequent menstruation with regular cycle: Secondary | ICD-10-CM | POA: Insufficient documentation

## 2013-07-08 DIAGNOSIS — Z01419 Encounter for gynecological examination (general) (routine) without abnormal findings: Secondary | ICD-10-CM

## 2013-07-08 DIAGNOSIS — E785 Hyperlipidemia, unspecified: Secondary | ICD-10-CM

## 2013-07-08 DIAGNOSIS — Z87410 Personal history of cervical dysplasia: Secondary | ICD-10-CM

## 2013-07-08 LAB — LIPID PANEL
CHOL/HDL RATIO: 3.2 ratio
Cholesterol: 156 mg/dL (ref 0–200)
HDL: 49 mg/dL (ref >39)
LDL CALC: 74 mg/dL (ref 0–99)
Triglycerides: 163 mg/dL — ABNORMAL HIGH (ref <150)
VLDL: 33 mg/dL (ref 0–40)

## 2013-07-08 LAB — CBC WITH DIFFERENTIAL/PLATELET
BASOS ABS: 0 10*3/uL (ref 0.0–0.1)
Basophils Relative: 0 % (ref 0–1)
Eosinophils Absolute: 0.1 10*3/uL (ref 0.0–0.7)
Eosinophils Relative: 1 % (ref 0–5)
HCT: 31.5 % — ABNORMAL LOW (ref 36.0–46.0)
Hemoglobin: 10.7 g/dL — ABNORMAL LOW (ref 12.0–15.0)
LYMPHS PCT: 41 % (ref 12–46)
Lymphs Abs: 2.3 10*3/uL (ref 0.7–4.0)
MCH: 24.2 pg — ABNORMAL LOW (ref 26.0–34.0)
MCHC: 34 g/dL (ref 30.0–36.0)
MCV: 71.1 fL — ABNORMAL LOW (ref 78.0–100.0)
MONO ABS: 0.5 10*3/uL (ref 0.1–1.0)
Monocytes Relative: 9 % (ref 3–12)
NEUTROS ABS: 2.7 10*3/uL (ref 1.7–7.7)
Neutrophils Relative %: 49 % (ref 43–77)
PLATELETS: 260 10*3/uL (ref 150–400)
RBC: 4.43 MIL/uL (ref 3.87–5.11)
RDW: 15.8 % — AB (ref 11.5–15.5)
WBC: 5.5 10*3/uL (ref 4.0–10.5)

## 2013-07-08 LAB — COMPREHENSIVE METABOLIC PANEL
ALBUMIN: 4.1 g/dL (ref 3.5–5.2)
ALK PHOS: 65 U/L (ref 39–117)
ALT: 13 U/L (ref 0–35)
AST: 17 U/L (ref 0–37)
BUN: 8 mg/dL (ref 6–23)
CHLORIDE: 107 meq/L (ref 96–112)
CO2: 24 mEq/L (ref 19–32)
CREATININE: 0.59 mg/dL (ref 0.50–1.10)
Calcium: 8.7 mg/dL (ref 8.4–10.5)
Glucose, Bld: 100 mg/dL — ABNORMAL HIGH (ref 70–99)
POTASSIUM: 4 meq/L (ref 3.5–5.3)
Sodium: 138 mEq/L (ref 135–145)
Total Bilirubin: 0.4 mg/dL (ref 0.2–1.2)
Total Protein: 6.4 g/dL (ref 6.0–8.3)

## 2013-07-08 LAB — TSH: TSH: 1.907 u[IU]/mL (ref 0.350–4.500)

## 2013-07-08 NOTE — Addendum Note (Signed)
Addended by: Thurnell Garbe A on: 07/08/2013 09:24 AM   Modules accepted: Orders

## 2013-07-08 NOTE — Progress Notes (Addendum)
Gabrielle Cameron [facility] 06/03/1970 [id number]   History:    43 y.o.  for annual gyn exam who states that for the past 4 months her cycles although regular have lasted 7-9 days with a lot of cramping and passage of large clots. Patient did have endometrial ablation via her option technique here in our office in 2008 and had done well until recently. Patient with past history of anemia she is currently taking one iron tablet daily. 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 Endocervix, curettage  - DETACHED FRAGMENTS OF LOW GRADE SQUAMOUS INTRAEPITHELIAL LESION, CIN-I (MILD  DYSPLASIA). SEE COMMENT.  - BENIGN ENDOCERVICAL MUCOSA.  - SCANT FRAGMENTS OF BENIGN PROLIFERATIVE PHASE ENDOMETRIUM.  2. Cervix, biopsy, 4 o'clock  - LOW GRADE SQUAMOUS INTRAEPITHELIAL LESION, CIN-I (MILD DYSPLASIA). SEE COMMENT.  3. Cervix, biopsy, 12 o'clock  - LOW GRADE SQUAMOUS INTRAEPITHELIAL LESION, CIN-I (MILD DYSPLASIA). SEE COMMENT.  Microscopic Comment  1. 2, and 3: The findings correlate with the previous Pap test results ([id number]).  Follow Pap smear 2014 was normal. Patient has had issues with compliance with Lipitor for hypercholesterolemia and and she restarted again this February. She is testing today for blood work. She was weighing 185 pounds now weighing 203 pounds.    Past medical history,surgical history, family history and social history were all reviewed and documented in the EPIC chart.  Gynecologic History Patient's last menstrual period was 06/26/2013. Contraception: vasectomy Last Pap: See  above. Results were: normal Last mammogram: July 2014. Results were: normal  Obstetric History OB History  Gravida Para Term Preterm AB SAB TAB Ectopic Multiple Living  4 4 4       4     # Outcome Date GA Lbr Len/2nd Weight Sex Delivery Anes PTL Lv  4 TRM     F SVD  N Y  3 TRM     F SVD  N Y  2 TRM     F SVD  N Y  1 TRM     F SVD  N Y       ROS: A ROS was performed and pertinent positives and negatives are included in the history.  GENERAL: No fevers or chills. HEENT: No change in vision, no earache, sore throat or sinus congestion. NECK: No pain or stiffness. CARDIOVASCULAR: No chest pain or pressure. No palpitations. PULMONARY: No shortness of breath, cough or wheeze. GASTROINTESTINAL: No abdominal pain, nausea, vomiting or diarrhea, melena or bright red blood per rectum. GENITOURINARY: No urinary frequency, urgency, hesitancy or dysuria. MUSCULOSKELETAL: No joint or muscle pain, no back pain, no recent trauma. DERMATOLOGIC: No rash, no itching, no lesions. ENDOCRINE: No polyuria, polydipsia, no heat or cold intolerance. No recent change in weight. HEMATOLOGICAL: No anemia or easy bruising or bleeding. NEUROLOGIC: No headache, seizures, numbness, tingling or weakness. PSYCHIATRIC: No depression, no loss of interest in normal activity or change in sleep pattern.     Exam: chaperone present  BP 126/82  Ht 5\' 3"  (1.6 m)  Wt 203 lb (92.08 kg)  BMI 35.97 kg/m2  LMP 06/26/2013  Body mass index is 35.97  kg/(m^2).  General appearance : Well developed well nourished female. No acute distress HEENT: Neck supple, trachea midline, no carotid bruits, no thyroidmegaly Lungs: Clear to auscultation, no rhonchi or wheezes, or rib retractions  Heart: Regular rate and rhythm, no murmurs or gallops Breast:Examined in sitting and supine position were symmetrical in appearance, no palpable masses or tenderness,  no skin retraction, no nipple inversion, no nipple discharge, no skin discoloration, no  axillary or supraclavicular lymphadenopathy Abdomen: no palpable masses or tenderness, no rebound or guarding Extremities: no edema or skin discoloration or tenderness  Pelvic:  Bartholin, Urethra, Skene Glands: Within normal limits             Vagina: No gross lesions or discharge  Cervix: No gross lesions or discharge, friable contact  Uterus  anteverted, normal size, shape and consistency, non-tender and mobile  Adnexa  Without masses or tenderness  Anus and perineum  normal   Rectovaginal  normal sphincter tone without palpated masses or tenderness             Hemoccult not indicated     Assessment/Plan:  43 y.o. female for annual exam with 4 months history of worsening dysmenorrhea and menorrhagia cycles lasting 7-9 days. Patient with history of anemia. Patient with history of hyperlipidemia restart her Lipitor and fish oil this year. Since she is fasting today we will draw the following labs:: Fasting lipid profile, comprehensive metabolic panel, TSH, CBC and urinalysis. Pap smear was done today. Patient has scheduled mammogram for next month. She will return back in one-to two-week sonohysterogram. Literature information regarding IUD as well as on vaginal hysterectomy was provided. Instructions were provided.  Note: This dictation was prepared with  Dragon/digital dictation along withSmart phrase technology. Any transcriptional errors that result from this process are unintentional.   [name] MD, 9:11 AM 07/08/2013

## 2013-07-08 NOTE — Patient Instructions (Signed)
Levonorgestrel intrauterine device (IUD) Qu es este medicamento? El LEVONORGESTREL (DIU) es un dispositivo anticonceptivo (control de natalidad). El dispositivo se coloca dentro del tero por un profesional de la salud. Se utiliza para Therapist, occupational y tambin se puede Risk manager para tratar el sangrado abundante que ocurre durante su perodo. Dependiendo del dispositivo, se puede utilizar por 3 a 5 aos. Este medicamento puede ser utilizado para otros usos; si tiene alguna pregunta consulte con su proveedor de atencin mdica o con su farmacutico. MARCAS COMERCIALES DISPONIBLES: Kennieth Rad le debo informar a mi profesional de la salud antes de tomar este medicamento? Necesita saber si usted presenta alguno de los siguientes problemas o situaciones: -exmen de Papanicolaou anormal -cncer de mama, cuello del tero o tero -diabetes -endometritis -si tiene una infeccin plvica o genital actual o en el pasado -tiene ms de una pareja sexual o si su pareja tiene ms de una pareja -enfermedad cardiaca -antecedente de embarazo tubrico o ectpico -problemas del sistema inmunolgico -DIU colocado -enfermedad heptica o tumor del hgado -problemas con la coagulacin o si toma diluyentes sanguneos -[place] medicamentos intravenoso -forma inusual del tero -sangrado vaginal que no tiene explicacin -una reaccin alrgica o inusual al levonorgestrel, a otras hormonas, a la silicona o polietilenos, a otros medicamentos, alimentos, colorantes o conservantes -si est embarazada o buscando quedar embarazada -si est amamantando a un beb Cmo debo utilizar este medicamento? Un profesional de Estate agent este dispositivo en el tero. Hable con su pediatra para informarse acerca del uso de este medicamento en nios. Puede requerir atencin especial. Sobredosis: Pngase en contacto inmediatamente con un centro toxicolgico o una sala de urgencia si usted cree que haya tomado demasiado  medicamento. ATENCIN: [employer] es solo para usted. No comparta este medicamento con nadie. Qu sucede si me olvido de una dosis? No se aplica en este caso. Qu puede interactuar con este medicamento? No tome esta medicina con ninguno de los siguientes medicamentos: -amprenavir -bosentano -fosamprenavir Esta medicina tambin puede interactuar con los siguientes medicamentos: -aprepitant -barbitricos para producir el sueo o para el tratamiento de convulsiones -bexaroteno -griseofulvina -medicamentos para tratar los convulsiones, tales como [place], [place], [place], [place], [place], topiramato -modafinilo -pioglitazona -rifabutina -rifampicina -rifapentina -algunos medicamentos para tratar el virus VIH, tales como atazanavir, indinavir, lopinavir, nelfinavir, tipranavir, ritonavir -hierba de [place] -warfarina Puede ser que esta lista no menciona todas las posibles interacciones. Informe a su profesional de [employer]	[place] de [employer] productos a base de hierbas, medicamentos de [place] o suplementos nutritivos que est tomando. Si usted fuma, consume bebidas alcohlicas o si utiliza drogas ilegales, indqueselo tambin a su profesional de [employer]	[place]. Algunas sustancias pueden interactuar con su medicamento. A qu debo estar atento al usar [employer]? Visite a su mdico o a su profesional de la salud para chequear su evolucin peridicamente. Visite a su mdico si usted o su pareja tiene relaciones sexuales con [employer], se vuelve VIH positivo o contrae una enfermedad de transmisin sexual. Este medicamento no la protege de la infeccin por VIH (SIDA) ni de ninguna otra enfermedad de transmisin sexual. Puede controlar la ubicacin del DIU usted misma palpando con sus dedos limpios los hilos en la parte anterior de la vagina. No tire de los hilos. Es un buen hbito controlar la ubicacin del dispositivo despus de cada perodo menstrual. Si no slo  siente los hilos sino que adems siente otra parte ms del DIU o si no puede sentir los hilos, consulte a su mdico  inmediatamente. El DIU puede salirse por s solo. Puede quedar embarazada si el dispositivo se sale de Chief of Staff. Utilice un mtodo anticonceptivo adicional, como preservativos, y consulte a su proveedor de atencin mdica s observa que el DIU se sali de Chief of Staff. La utilizacin de tampones no cambia la posicin del DIU y no hay inconvenientes en usarlos durante su perodo. Qu efectos secundarios puedo tener al [employer] este medicamento? Efectos secundarios que debe informar a su mdico o a Barrister's clerk de la salud tan pronto como sea posible: -Chief of Staff como erupcin cutnea, picazn o urticarias, hinchazn de la cara, labios o lengua -fiebre, sntomas gripales -llagas genitales -alta presin sangunea -ausencia de un perodo menstrual durante 6 semanas mientras lo utiliza -Social research officer, government, Occupational hygienist en las piernas -dolor o sensibilidad del plvico -dolor de cabeza repentino o severo -signos de Media planner -calambres estomacales -falta de aliento repentina -problemas de coordinacin, del habla, al caminar -sangrado, flujo vaginal inusual -color amarillento de los ojos o la piel Efectos secundarios que, por lo general, no requieren atencin mdica (debe informarlos a su mdico o a su profesional de la salud si persisten o si son molestos): -acn -dolor de pecho -cambios en el deseo sexual o capacidad -cambios de peso -calambres, Tree surgeon o sensacin de [employer] se introduce el dispositivo -dolor de cabeza -sangrado menstruales irregulares en los primeros 3 a 6 meses de usar -nuseas Puede ser que esta lista no menciona todos los posibles efectos secundarios. Comunquese a su mdico por asesoramiento mdico [employer]. Usted puede informar los efectos secundarios a la FDA por telfono al 1-800-FDA-1088. Dnde debo guardar mi medicina? No  se aplica en este caso. ATENCIN: Este folleto es un resumen. Puede ser que no cubra toda la posible informacin. Si usted tiene preguntas acerca de esta medicina, consulte con su mdico, su farmacutico o su profesional de Technical sales engineer.  2014, Elsevier/Gold Standard. (2011-03-01 16:57:41) Informacin sobre la histerectoma (Hysterectomy Information)  La histerectoma es una ciruga que se realiza para extirpar el tero. Esta ciruga se puede realizar para tratar varios problemas mdicos. Despus de la ciruga, no volver a Personal assistant. Adems, debido a esta ciruga no podr quedar embarazada (estril). Asimismo, durante esta ciruga se podrn extirpar las trompas de falopio y los ovarios (salpingooforectoma bilateral).  RAZONES PARA REALIZAR UNA HISTERECTOMA  Sangrado persistente, anormal.  Dolor o infeccin persistente (crnico) en la pelvis.  El endometrio comienza a desarrollarse fuera del tero (endometriosis).  El endometrio comienza a desarrollarse en el msculo del tero (adenomiosis).  El tero desciende hacia la vagina (prolapso de los rganos de la pelvis).  Neoplasias benignas en el tero (fibromas uterinos) que provocan sntomas.  Clulas precancerosas.  Cncer cervical o cncer uterino. TIPOS DE HISTERECTOMA  Histerectoma supracervical: en este tipo de intervencin, se extirpa la parte superior del tero, pero no el cuello del tero.  Histerectoma total: se extirpan el tero y el cuello del tero.  Histerectoma radical: se extirpan el tero, el cuello del tero y los tejidos fibrosos que sostienen al tero en su lugar en la pelvis (parametrio). FORMAS EN QUE SE PUEDE REALIZAR UNA HISTERECTOMA  Histerectoma abdominal: se realiza un corte quirrgico grande (incisin) en el abdomen. Se extirpa el tero a travs de esta incisin.  Histerectoma vaginal: se realiza una incisin en la vagina. Se extirpa el tero a travs de esta incisin. No hay  incisiones abdominales.  Histerectoma laparoscpica convencional: se realizan tres o cuatro incisiones pequeas en el abdomen.  Se inserta un tubo delgado y luminoso con una cmara (laparoscopio) en una de las incisiones. A travs del resto de las incisiones se insertan otros instrumentos quirrgicos. El tero se corta en trozos pequeos. Las piezas pequeas se eliminan a travs de las incisiones, o se retiran a travs de la vagina.  Histerectoma vaginal asistida por laparoscopia (HVAL): se realizan tres o cuatro incisiones pequeas en el abdomen. Parte de la ciruga se realiza por va laparoscpica y parte por va vaginal. El tero se extirpa a travs de la vagina.  Histerectoma laparoscpica asistida por robot: se insertan un laparoscopio y otros instrumentos quirrgicos en 3 o 4 incisiones pequeas en el abdomen. Se utiliza un dispositivo controlado por computadora para que el cirujano vea una imagen en 3D que lo ayuda a Chief Technology Officer los instrumentos quirrgicos. Esto permite movimientos ms precisos de los instrumentos quirrgicos. El tero se corta en trozos pequeos y se retira a travs de las incisiones o se elimina a travs de la vagina. RIESGOS Y [place] complicaciones asociadas con este procedimiento incluyen las siguientes:  Hemorragia y riesgos de Designer, industrial/product una transfusin sangunea. Infrmele al mdico si no quiere recibir ningn tipo de hemoderivados.  Cogulos de [employer] piernas o los pulmones.  Infeccin.  Lesin en los rganos circundantes.  Problemas o efectos secundarios relacionados con la anestesia.  Transformacin de cualquiera de las otras tcnicas en una histerectoma abdominal. QU ESPERAR DESPUES DE UNA HISTERECTOMA  Le darn medicamentos para el dolor.  Necesitar que alguien Nature conservation officer con usted durante los primeros 3 a 5 das despus de que regrese a Medical illustrator.  Tendr que ver al cirujano para un seguimiento 2 a 4 semanas despus de la  ciruga para evaluar su progreso.  Posiblemente tenga sntomas tempranos de menopausia, como sofocos, sudoracin nocturna e insomnio.  Si le han realizado una histerectoma debido a un problema que no era cncer ni una afeccin que poda causar cncer, ya no necesitar una prueba de Papanicolaou. Sin embargo, si ya no necesita hacerse un Papanicolau, es una buena idea hacerse un examen regularmente para asegurarse de que no hay otros problemas. Document Released: 01/10/2005 Document Revised: 10/31/2012 [facility] Patient Information 2014 [place], [place].

## 2013-07-09 ENCOUNTER — Other Ambulatory Visit: Payer: Self-pay

## 2013-07-09 ENCOUNTER — Telehealth: Payer: Self-pay

## 2013-07-09 DIAGNOSIS — Z1231 Encounter for screening mammogram for malignant neoplasm of breast: Secondary | ICD-10-CM

## 2013-07-09 LAB — URINALYSIS W MICROSCOPIC + REFLEX CULTURE
BACTERIA UA: NONE SEEN
Bilirubin Urine: NEGATIVE
Casts: NONE SEEN
Crystals: NONE SEEN
Glucose, UA: NEGATIVE mg/dL
Hgb urine dipstick: NEGATIVE
KETONES UR: NEGATIVE mg/dL
NITRITE: NEGATIVE
Protein, ur: NEGATIVE mg/dL
Specific Gravity, Urine: 1.01 (ref 1.005–1.030)
Squamous Epithelial / LPF: NONE SEEN
UROBILINOGEN UA: 0.2 mg/dL (ref 0.0–1.0)
pH: 6.5 (ref 5.0–8.0)

## 2013-07-09 LAB — CYTOLOGY - PAP

## 2013-07-09 NOTE — Telephone Encounter (Signed)
Dr. Toney Rakes had sent me order for surgery [facility].  Gabrielle Cameron spoke with patient, as she is Spanish speaking. The patient relayed that she has not made her decision regarding surgery yet.  She is also considering a Mirena IUD.  She is going to discuss with husband and will call Gabrielle Cameron as soon as decision is reached. Of note, her SHGM is scheduled for 08/09/13.

## 2013-07-10 ENCOUNTER — Other Ambulatory Visit: Payer: Self-pay | Admitting: Gynecology

## 2013-07-10 MED ORDER — NITROFURANTOIN MONOHYD MACRO 100 MG PO CAPS: 100.0000 mg | ORAL_CAPSULE | Freq: Two times a day (BID) | ORAL | Status: DC

## 2013-07-11 LAB — URINE CULTURE

## 2013-08-05 ENCOUNTER — Ambulatory Visit
Admission: RE | Admit: 2013-08-05 | Discharge: 2013-08-05 | Disposition: A | Discharge status: Home / Self Care | Payer: No Typology Code available for payment source | Source: Ambulatory Visit

## 2013-08-05 DIAGNOSIS — Z1231 Encounter for screening mammogram for malignant neoplasm of breast: Secondary | ICD-10-CM

## 2013-08-06 ENCOUNTER — Other Ambulatory Visit: Payer: Self-pay | Admitting: Gynecology

## 2013-08-06 DIAGNOSIS — N920 Excessive and frequent menstruation with regular cycle: Secondary | ICD-10-CM

## 2013-08-06 DIAGNOSIS — D649 Anemia, unspecified: Secondary | ICD-10-CM

## 2013-08-09 ENCOUNTER — Ambulatory Visit (INDEPENDENT_AMBULATORY_CARE_PROVIDER_SITE_OTHER): Payer: Self-pay | Admitting: Gynecology

## 2013-08-09 ENCOUNTER — Telehealth: Payer: Self-pay | Admitting: *Deleted

## 2013-08-09 ENCOUNTER — Ambulatory Visit (INDEPENDENT_AMBULATORY_CARE_PROVIDER_SITE_OTHER): Payer: Self-pay

## 2013-08-09 DIAGNOSIS — N852 Hypertrophy of uterus: Secondary | ICD-10-CM

## 2013-08-09 DIAGNOSIS — B3731 Acute candidiasis of vulva and vagina: Secondary | ICD-10-CM

## 2013-08-09 DIAGNOSIS — D500 Iron deficiency anemia secondary to blood loss (chronic): Secondary | ICD-10-CM

## 2013-08-09 DIAGNOSIS — N83209 Unspecified ovarian cyst, unspecified side: Secondary | ICD-10-CM

## 2013-08-09 DIAGNOSIS — D649 Anemia, unspecified: Secondary | ICD-10-CM

## 2013-08-09 DIAGNOSIS — N390 Urinary tract infection, site not specified: Secondary | ICD-10-CM

## 2013-08-09 DIAGNOSIS — N840 Polyp of corpus uteri: Secondary | ICD-10-CM

## 2013-08-09 DIAGNOSIS — B373 Candidiasis of vulva and vagina: Secondary | ICD-10-CM

## 2013-08-09 DIAGNOSIS — N949 Unspecified condition associated with female genital organs and menstrual cycle: Secondary | ICD-10-CM

## 2013-08-09 DIAGNOSIS — N925 Other specified irregular menstruation: Secondary | ICD-10-CM

## 2013-08-09 DIAGNOSIS — N920 Excessive and frequent menstruation with regular cycle: Secondary | ICD-10-CM

## 2013-08-09 DIAGNOSIS — N938 Other specified abnormal uterine and vaginal bleeding: Secondary | ICD-10-CM

## 2013-08-09 LAB — URINALYSIS W MICROSCOPIC + REFLEX CULTURE
BILIRUBIN URINE: NEGATIVE
CRYSTALS: NONE SEEN
Casts: NONE SEEN
Glucose, UA: NEGATIVE mg/dL
KETONES UR: NEGATIVE mg/dL
Leukocytes, UA: NEGATIVE
NITRITE: NEGATIVE
PROTEIN: NEGATIVE mg/dL
Specific Gravity, Urine: 1.025 (ref 1.005–1.030)
UROBILINOGEN UA: 0.2 mg/dL (ref 0.0–1.0)
pH: 5.5 (ref 5.0–8.0)

## 2013-08-09 MED ORDER — FLUCONAZOLE 150 MG PO TABS: 150.0000 mg | ORAL_TABLET | Freq: Once | ORAL | Status: DC

## 2013-08-09 NOTE — Telephone Encounter (Signed)
Pt called and said Rx for Diflucan 150 # 1 was not sent to pharmacy, Rx was sent to wrong pharmacy, pt would like Rx sent to [facility]. This was done

## 2013-08-09 NOTE — Progress Notes (Signed)
[place] Patient is a 43 year old who was seen in the office on June 15 for her annual exam. Patient had been complaining that the previous 4 months her cycles were regular but more heavy lasting 7-9 days with passage of large clots and cramping.Patient did have endometrial ablation via her option technique here in our office in 2008 and had done well until recently. Patient presented to the office today July 17 for a sonohysterogram for possible hysterectomy versus removal of any possible polyp or fibroid if identified.  Ultrasound today: Uterus measured 10.8 x 6.8 x 5.7 cm with endometrial stripe of 16.1 (last menstrual period 2 weeks ago). Left ovary normal right ovary echo free thinwall avascular cyst measuring 2.9 x 1.8 mm. No free fluid seen. The cervix was cleansed with Betadine solution and a sterile catheter was introduced into the uterine cavity and a fundal polyp measured 15 x 11 mm was noted.  Patient will be scheduled for resectoscopic polypectomy/ D&C/endometrial ablation   Pertinent Gynecological History: Menses: Heavy lasting 7-10 days Bleeding: Same as above Contraception: vasectomym DES exposure: unknown Blood transfusions: none Sexually transmitted diseases: no past history Previous GYN Procedures: Endometrial ablation  Last mammogram: normal Date:  2015 Last pap: abnormal: ASCUS negative HPV Date: 42 OB History: G 4, P 4   Menstrual History: Menarche age: 17 No LMP recorded.    Past Medical History  Diagnosis Date  . Osteoarthritis   . Hyperlipidemia   . High risk HPV infection     Past Surgical History  Procedure Laterality Date  . Endometrial ablation  12/08/2006    HER OPTION    Family History  Problem Relation Age of Onset  . Hypertension Mother   . Diabetes Father     Social History:  reports that she has never smoked. She has never used smokeless tobacco. She reports that she drinks alcohol. She reports that she does not use  illicit drugs.  Allergies: No Known Allergies   (Not in a hospital admission)  REVIEW OF SYSTEMS: A ROS was performed and pertinent positives and negatives are included in the history.  GENERAL: No fevers or chills. HEENT: No change in vision, no earache, sore throat or sinus congestion. NECK: No pain or stiffness. CARDIOVASCULAR: No chest pain or pressure. No palpitations. PULMONARY: No shortness of breath, cough or wheeze. GASTROINTESTINAL: No abdominal pain, nausea, vomiting or diarrhea, melena or bright red blood per rectum. GENITOURINARY: No urinary frequency, urgency, hesitancy or dysuria. MUSCULOSKELETAL: No joint or muscle pain, no back pain, no recent trauma. DERMATOLOGIC: No rash, no itching, no lesions. ENDOCRINE: No polyuria, polydipsia, no heat or cold intolerance. No recent change in weight. HEMATOLOGICAL: No anemia or easy bruising or bleeding. NEUROLOGIC: No headache, seizures, numbness, tingling or weakness. PSYCHIATRIC: No depression, no loss of interest in normal activity or change in sleep pattern.     There were no vitals taken for this visit.  Physical Exam:  HEENT:unremarkable Neck:Supple, midline, no thyroid megaly, no carotid bruits Lungs:  Clear to auscultation no rhonchi's or wheezes Heart:Regular rate and rhythm, no murmurs or gallops Breast Exam: Symmetrical in appearance no couple masses or tenderness examined on June 15 Abdomen: Same as above Pelvic:BUS within normal limits Vagina: No lesions or discharge some menstrual blood Cervix: No Uterus: Anteverted normal size shape and consistency Adnexa: No palpable mass or tenderness Extremities: No cords, no edema Rectal: Not examined   Assessment/Plan: Patient with menorrhagia. Patient with prior history of Her Option endometrial ablation  over 5 years ago. Sonohysterogram demonstrated endometrial polyp. Patient will be scheduled for resectoscopic polypectomy and D&C along with endometrial ablation. The risk  of surgery were discussed as follows:                        Patient was counseled as to the risk of surgery to include the following:  1. Infection (prohylactic antibiotics will be administered)  2. DVT/Pulmonary Embolism (prophylactic pneumo compression stockings will be used)  3.Trauma to internal organs requiring additional surgical procedure to repair any injury to     Internal organs requiring perhaps additional hospitalization days.  4.Hemmorhage requiring transfusion and blood products which carry risks such as  anaphylactic reaction, hepatitis and AIDS  Patient had received literature information on the procedure scheduled and all her questions were answered and fully accepts all risk.   [facility] HMD11:11 AMTD@Note : This dictation was prepared with  Dragon/digital dictation along withSmart phrase technology. Any transcriptional errors that result from this process are unintentional.      [name] 08/09/2013, 10:53 AM  Note: This dictation was prepared with  Dragon/digital dictation along withSmart phrase technology. Any transcriptional errors that result from this process are unintentional.

## 2013-08-12 ENCOUNTER — Other Ambulatory Visit: Payer: Self-pay | Admitting: Gynecology

## 2013-08-12 DIAGNOSIS — N840 Polyp of corpus uteri: Secondary | ICD-10-CM

## 2013-08-12 DIAGNOSIS — D649 Anemia, unspecified: Secondary | ICD-10-CM

## 2013-08-12 DIAGNOSIS — N920 Excessive and frequent menstruation with regular cycle: Secondary | ICD-10-CM

## 2013-08-12 DIAGNOSIS — N852 Hypertrophy of uterus: Secondary | ICD-10-CM

## 2013-08-12 DIAGNOSIS — N83209 Unspecified ovarian cyst, unspecified side: Secondary | ICD-10-CM

## 2013-08-13 LAB — URINE CULTURE
COLONY COUNT: NO GROWTH
Organism ID, Bacteria: NO GROWTH

## 2013-08-19 ENCOUNTER — Encounter (HOSPITAL_COMMUNITY)
Admission: RE | Admit: 2013-08-19 | Discharge: 2013-08-19 | Disposition: A | Discharge status: Home / Self Care | Payer: Self-pay | Source: Ambulatory Visit | Attending: Gynecology | Admitting: Gynecology

## 2013-08-19 ENCOUNTER — Encounter (HOSPITAL_COMMUNITY): Payer: Self-pay

## 2013-08-19 DIAGNOSIS — Z01812 Encounter for preprocedural laboratory examination: Secondary | ICD-10-CM | POA: Insufficient documentation

## 2013-08-19 DIAGNOSIS — Z01818 Encounter for other preprocedural examination: Secondary | ICD-10-CM | POA: Insufficient documentation

## 2013-08-19 LAB — URINALYSIS, ROUTINE W REFLEX MICROSCOPIC
Bilirubin Urine: NEGATIVE
Glucose, UA: NEGATIVE mg/dL
KETONES UR: NEGATIVE mg/dL
LEUKOCYTES UA: NEGATIVE
NITRITE: NEGATIVE
PH: 6 (ref 5.0–8.0)
Protein, ur: NEGATIVE mg/dL
SPECIFIC GRAVITY, URINE: 1.02 (ref 1.005–1.030)
Urobilinogen, UA: 0.2 mg/dL (ref 0.0–1.0)

## 2013-08-19 LAB — URINE MICROSCOPIC-ADD ON

## 2013-08-19 LAB — CBC
HCT: 35.2 % — ABNORMAL LOW (ref 36.0–46.0)
Hemoglobin: 12.4 g/dL (ref 12.0–15.0)
MCH: 26.4 pg (ref 26.0–34.0)
MCHC: 35.2 g/dL (ref 30.0–36.0)
MCV: 74.9 fL — ABNORMAL LOW (ref 78.0–100.0)
PLATELETS: 230 10*3/uL (ref 150–400)
RBC: 4.7 MIL/uL (ref 3.87–5.11)
RDW: 17.6 % — AB (ref 11.5–15.5)
WBC: 8.1 10*3/uL (ref 4.0–10.5)

## 2013-08-19 NOTE — Patient Instructions (Signed)
[place]  08/19/2013   Your procedure is scheduled on:  08/21/13  Enter through the Main Entrance of [facility] up the phone at the desk and dial [phone].   Call this number if you have problems the morning of surgery: [phone]   Remember:   Do not eat food:After Midnight.  Do not drink clear liquids: after 9AM.  Take these medicines the morning of surgery with A SIP OF WATER: NA   Do not wear jewelry, make-up or nail polish.  Do not wear lotions, powders, or perfumes. You may wear deodorant.  Do not shave 48 hours prior to surgery.  Do not bring valuables to the hospital.  [facility] is not   responsible for any belongings or valuables brought to the hospital.  Contacts, dentures or bridgework may not be worn into surgery.  Leave suitcase in the car. After surgery it may be brought to your room.  For patients admitted to the hospital, checkout time is 11:00 AM the day of              discharge.   Patients discharged the day of surgery will not be allowed to drive             home.  Name and phone number of your driver: NA  Special Instructions:      Please read over the following fact sheets that you were given:   Surgical Site Infection Prevention

## 2013-08-20 MED ORDER — DEXTROSE 5 % IV SOLN
2.0000 g | INTRAVENOUS | Status: AC
  Administered 2013-08-21: 2 g via INTRAVENOUS
  Filled 2013-08-20: qty 2

## 2013-08-21 ENCOUNTER — Encounter (HOSPITAL_COMMUNITY): Payer: MEDICAID | Admitting: Anesthesiology

## 2013-08-21 ENCOUNTER — Ambulatory Visit (HOSPITAL_COMMUNITY): Payer: MEDICAID | Admitting: Anesthesiology

## 2013-08-21 ENCOUNTER — Encounter (HOSPITAL_COMMUNITY)
Admission: RE | Disposition: A | Discharge status: Home / Self Care | Payer: Self-pay | Source: Ambulatory Visit | Attending: Gynecology

## 2013-08-21 ENCOUNTER — Ambulatory Visit (HOSPITAL_COMMUNITY)
Admission: RE | Admit: 2013-08-21 | Discharge: 2013-08-21 | Disposition: A | Discharge status: Home / Self Care | Payer: MEDICAID | Source: Ambulatory Visit | Attending: Gynecology | Admitting: Gynecology

## 2013-08-21 DIAGNOSIS — Z8249 Family history of ischemic heart disease and other diseases of the circulatory system: Secondary | ICD-10-CM | POA: Insufficient documentation

## 2013-08-21 DIAGNOSIS — M1990 Unspecified osteoarthritis, unspecified site: Secondary | ICD-10-CM | POA: Insufficient documentation

## 2013-08-21 DIAGNOSIS — N949 Unspecified condition associated with female genital organs and menstrual cycle: Secondary | ICD-10-CM

## 2013-08-21 DIAGNOSIS — Z833 Family history of diabetes mellitus: Secondary | ICD-10-CM | POA: Insufficient documentation

## 2013-08-21 DIAGNOSIS — N840 Polyp of corpus uteri: Secondary | ICD-10-CM | POA: Insufficient documentation

## 2013-08-21 DIAGNOSIS — Z9889 Other specified postprocedural states: Secondary | ICD-10-CM

## 2013-08-21 DIAGNOSIS — N920 Excessive and frequent menstruation with regular cycle: Secondary | ICD-10-CM | POA: Insufficient documentation

## 2013-08-21 DIAGNOSIS — E785 Hyperlipidemia, unspecified: Secondary | ICD-10-CM | POA: Insufficient documentation

## 2013-08-21 DIAGNOSIS — D649 Anemia, unspecified: Secondary | ICD-10-CM | POA: Insufficient documentation

## 2013-08-21 DIAGNOSIS — R87810 Cervical high risk human papillomavirus (HPV) DNA test positive: Secondary | ICD-10-CM | POA: Insufficient documentation

## 2013-08-21 DIAGNOSIS — D259 Leiomyoma of uterus, unspecified: Secondary | ICD-10-CM

## 2013-08-21 LAB — PREGNANCY, URINE: Preg Test, Ur: NEGATIVE

## 2013-08-21 SURGERY — DILATATION & CURETTAGE/HYSTEROSCOPY WITH MYOSURE
Anesthesia: General | Site: Vagina

## 2013-08-21 MED ORDER — KETOROLAC TROMETHAMINE 30 MG/ML IJ SOLN
INTRAMUSCULAR | Status: DC | PRN
  Administered 2013-08-21: 30 mg via INTRAVENOUS

## 2013-08-21 MED ORDER — GLYCINE 1.5 % IR SOLN
Status: DC | PRN
  Administered 2013-08-21: 3000 mL

## 2013-08-21 MED ORDER — FENTANYL CITRATE 0.05 MG/ML IJ SOLN
25.0000 ug | INTRAMUSCULAR | Status: DC | PRN
  Administered 2013-08-21 (×2): 25 ug via INTRAVENOUS

## 2013-08-21 MED ORDER — MEPERIDINE HCL 25 MG/ML IJ SOLN: 6.2500 mg | INTRAMUSCULAR | Status: DC | PRN

## 2013-08-21 MED ORDER — PROPOFOL 10 MG/ML IV EMUL
INTRAVENOUS | Status: AC
  Filled 2013-08-21: qty 20

## 2013-08-21 MED ORDER — OXYCODONE-ACETAMINOPHEN 5-325 MG PO TABS: 1.0000 | ORAL_TABLET | ORAL | Status: DC | PRN

## 2013-08-21 MED ORDER — MIDAZOLAM HCL 2 MG/2ML IJ SOLN: 0.5000 mg | Freq: Once | INTRAMUSCULAR | Status: DC | PRN

## 2013-08-21 MED ORDER — ONDANSETRON HCL 4 MG/2ML IJ SOLN
INTRAMUSCULAR | Status: DC | PRN
  Administered 2013-08-21: 4 mg via INTRAVENOUS

## 2013-08-21 MED ORDER — MIDAZOLAM HCL 2 MG/2ML IJ SOLN
INTRAMUSCULAR | Status: DC | PRN
  Administered 2013-08-21: 2 mg via INTRAVENOUS

## 2013-08-21 MED ORDER — PROMETHAZINE HCL 25 MG/ML IJ SOLN: 6.2500 mg | INTRAMUSCULAR | Status: DC | PRN

## 2013-08-21 MED ORDER — FENTANYL CITRATE 0.05 MG/ML IJ SOLN
INTRAMUSCULAR | Status: AC
  Filled 2013-08-21: qty 2

## 2013-08-21 MED ORDER — LIDOCAINE HCL (CARDIAC) 20 MG/ML IV SOLN
INTRAVENOUS | Status: AC
  Filled 2013-08-21: qty 5

## 2013-08-21 MED ORDER — SODIUM CHLORIDE 0.9 % IJ SOLN
INTRAMUSCULAR | Status: AC
  Filled 2013-08-21: qty 50

## 2013-08-21 MED ORDER — SCOPOLAMINE 1 MG/3DAYS TD PT72
MEDICATED_PATCH | TRANSDERMAL | Status: AC
  Administered 2013-08-21: 1.5 mg
  Filled 2013-08-21: qty 1

## 2013-08-21 MED ORDER — DEXAMETHASONE SODIUM PHOSPHATE 10 MG/ML IJ SOLN
INTRAMUSCULAR | Status: DC | PRN
  Administered 2013-08-21: 10 mg via INTRAVENOUS

## 2013-08-21 MED ORDER — KETOROLAC TROMETHAMINE 30 MG/ML IJ SOLN: 15.0000 mg | Freq: Once | INTRAMUSCULAR | Status: DC | PRN

## 2013-08-21 MED ORDER — FENTANYL CITRATE 0.05 MG/ML IJ SOLN
INTRAMUSCULAR | Status: DC | PRN
  Administered 2013-08-21: 100 ug via INTRAVENOUS
  Administered 2013-08-21: 25 ug via INTRAVENOUS
  Administered 2013-08-21: 50 ug via INTRAVENOUS
  Administered 2013-08-21: 25 ug via INTRAVENOUS
  Administered 2013-08-21: 50 ug via INTRAVENOUS

## 2013-08-21 MED ORDER — MIDAZOLAM HCL 2 MG/2ML IJ SOLN
INTRAMUSCULAR | Status: AC
  Filled 2013-08-21: qty 2

## 2013-08-21 MED ORDER — LACTATED RINGERS IV SOLN
INTRAVENOUS | Status: DC
  Administered 2013-08-21 (×2): via INTRAVENOUS

## 2013-08-21 MED ORDER — VASOPRESSIN 20 UNIT/ML IJ SOLN
INTRAMUSCULAR | Status: AC
  Filled 2013-08-21: qty 1

## 2013-08-21 MED ORDER — LACTATED RINGERS IR SOLN
Status: DC | PRN
  Administered 2013-08-21: 3000 mL

## 2013-08-21 MED ORDER — PROPOFOL 10 MG/ML IV BOLUS
INTRAVENOUS | Status: DC | PRN
  Administered 2013-08-21: 200 mg via INTRAVENOUS

## 2013-08-21 MED ORDER — VASOPRESSIN 20 UNIT/ML IJ SOLN
INTRAMUSCULAR | Status: DC | PRN
  Administered 2013-08-21: .5 [IU]

## 2013-08-21 MED ORDER — DEXAMETHASONE SODIUM PHOSPHATE 10 MG/ML IJ SOLN
INTRAMUSCULAR | Status: AC
  Filled 2013-08-21: qty 1

## 2013-08-21 MED ORDER — FENTANYL CITRATE 0.05 MG/ML IJ SOLN
INTRAMUSCULAR | Status: AC
Start: 2013-08-21 — End: 2013-08-21
  Filled 2013-08-21: qty 2

## 2013-08-21 MED ORDER — FENTANYL CITRATE 0.05 MG/ML IJ SOLN
INTRAMUSCULAR | Status: AC
  Administered 2013-08-21: 25 ug via INTRAVENOUS
  Filled 2013-08-21: qty 2

## 2013-08-21 MED ORDER — ONDANSETRON HCL 4 MG/2ML IJ SOLN
INTRAMUSCULAR | Status: AC
  Filled 2013-08-21: qty 2

## 2013-08-21 MED ORDER — METOCLOPRAMIDE HCL 10 MG PO TABS
10.0000 mg | ORAL_TABLET | Freq: Three times a day (TID) | ORAL | Status: DC
Start: 2013-08-21 — End: 2015-08-13

## 2013-08-21 MED ORDER — KETOROLAC TROMETHAMINE 30 MG/ML IJ SOLN
INTRAMUSCULAR | Status: AC
  Filled 2013-08-21: qty 1

## 2013-08-21 SURGICAL SUPPLY — 30 items
CANISTERS HI-FLOW 3000CC (CANNISTER) ×2 IMPLANT
CATH FOLEY 2WAY SLVR 30CC 16FR (CATHETERS) IMPLANT
CATH ROBINSON RED A/P 16FR (CATHETERS) ×2 IMPLANT
CLOTH BEACON ORANGE TIMEOUT ST (SAFETY) ×2 IMPLANT
CONTAINER PREFILL 10% NBF 60ML (FORM) ×4 IMPLANT
DEVICE MYOSURE CLASSIC (MISCELLANEOUS) ×2 IMPLANT
DEVICE MYOSURE LITE (MISCELLANEOUS) IMPLANT
DRAPE HYSTEROSCOPY (DRAPE) ×2 IMPLANT
ELECT LLETZ BALL 3MM DISP (ELECTRODE) ×2 IMPLANT
ELECTRODE ROLLER VERSAPOINT (ELECTRODE) IMPLANT
ELECTRODE RT ANGLE VERSAPOINT (CUTTING LOOP) IMPLANT
FILTER ARTHROSCOPY CONVERTOR (FILTER) ×2 IMPLANT
GLOVE BIOGEL PI IND STRL 8 (GLOVE) ×1 IMPLANT
GLOVE BIOGEL PI INDICATOR 8 (GLOVE) ×1
GLOVE ECLIPSE 7.5 STRL STRAW (GLOVE) ×4 IMPLANT
GOWN STRL REUS W/TWL LRG LVL3 (GOWN DISPOSABLE) ×4 IMPLANT
LOOP ANGLED CUTTING 22FR (CUTTING LOOP) IMPLANT
PACK VAGINAL MINOR WOMEN LF (CUSTOM PROCEDURE TRAY) ×2 IMPLANT
PAD OB MATERNITY 4.3X12.25 (PERSONAL CARE ITEMS) ×2 IMPLANT
PAD PREP 24X48 CUFFED NSTRL (MISCELLANEOUS) ×2 IMPLANT
PLUG CATH AND CAP STER (CATHETERS) IMPLANT
SEAL ROD LENS SCOPE MYOSURE (ABLATOR) ×2 IMPLANT
SET BERKELEY SUCTION TUBING (SUCTIONS) IMPLANT
SET TUBING HYSTEROSCOPY 2 NDL (TUBING) ×2 IMPLANT
SYR 30ML LL (SYRINGE) IMPLANT
TOWEL OR 17X24 6PK STRL BLUE (TOWEL DISPOSABLE) ×4 IMPLANT
TUBE HYSTEROSCOPY W Y-CONNECT (TUBING) ×2 IMPLANT
VACURETTE 8 RIGID CVD (CANNULA) IMPLANT
VACURETTE 9 RIGID CVD (CANNULA) IMPLANT
WATER STERILE IRR 1000ML POUR (IV SOLUTION) ×2 IMPLANT

## 2013-08-21 NOTE — Op Note (Signed)
   Operative Note  08/21/2013  2:28 PM  PATIENT:  Gabrielle Cameron  43 y.o. female  PRE-OPERATIVE DIAGNOSIS:  menorrhagia  POST-OPERATIVE DIAGNOSIS:  menorrhagia  PROCEDURE:  Procedure(s): DILATATION & CURETTAGE/HYSTEROSCOPY WITH MYOSURE ABLATION WITH ROLLER BAR ENDO ABLATION  SURGEON:  Surgeon(s): [name], MD  ANESTHESIA:   general  FINDINGS: 2 large endometrial polyps were noted in the center portion of the uterus attached posteriorly. Both tubal ostia were identified. Endocervical canal was smooth.  DESCRIPTION OF OPERATION:FINDINGS: The patient was taken to the operating room where she underwent a successful general endotracheal anesthesia. Patient had PAS stockings for DVT prophylaxis. She received 2 g of Ancef preop. Time out was undertaken to properly identify the patient and the proper operation scheduled. The vagina and perineum were prepped and draped in usual sterile fashion. A red rubber Quentin Cornwall was inserted to empty the bladder is content for approximately 50 cc. Bimanual examination demonstrated an anteverted uterus. Patient's legs were in the high lithotomy position. A weighted speculum was placed in the posterior vaginal vault. A single-tooth tenaculum was placed on the anterior cervical lip. The uterus sounded to 7/2 cm. Pratt dilator were used to dilate the cervical canal to a 23 mm size. The Hologic Myosure resectoscopic morcellator with a scope of 6.25 mm and an operating blade of 3.0 mm was introduced into the intrauterine cavity. 0.9% normal saline was the distending media. The endometrial cavity was inspected with the above mentioned findings.. With the resectoscopic morcellator they were removed and submitted for histological evaluation. Fluid deficit was approximately 175 cc. After this the working element was changed for the roller ball. The distending media was then changed to 1.5% glycine and the uterine cavity was irrigated with this solution  before the ablation was undertaken to completely washed out to normal saline. The roller ball was introduced into the uterine cavity. The 2.9 mm size scope was used for this part of the operation The [employer] electrical surgical generator settings were 100  on the cutting mode and 80 on the cautery mode. The entire cavity was ablated in a systematic fashion to the level of the internal cervical os and close as possible to both tubal ostia. Fluid deficit was approximately 400 cc. The single-tooth tenaculum was removed patient was extubated transferred to recovery room stable vital signs blood loss was minimal. She received 30 mg of Toradol in route to the recovery room.    ESTIMATED BLOOD LOSS: Minimal  Intake/Output Summary (Last 24 hours) at 08/21/13 1428 Last data filed at 08/21/13 1300  Gross per 24 hour  Intake      0 ml  Output     50 ml  Net    -50 ml     BLOOD ADMINISTERED:none   LOCAL MEDICATIONS USED:  OTHER Pitressin was injected into the cervical stroma at the 248 and 10:00 position for 10 cc. (0.5 units of Pitocin 30 cc of normal saline)/  SPECIMEN:  Source of Specimen:  Uterine polyps  DISPOSITION OF SPECIMEN:  PATHOLOGY  COUNTS:  YES  PLAN OF CARE: Transfer to [employer] HMD2:28 PMTD@  Note: This dictation was prepared with  Dragon/digital dictation along withSmart phrase technology. Any transcriptional errors that result from this process are unintentional.

## 2013-08-21 NOTE — H&P (View-Only) (Signed)
[place] Patient is a 43 year old who was seen in the office on June 15 for her annual exam. Patient had been complaining that the previous 4 months her cycles were regular but more heavy lasting 7-9 days with passage of large clots and cramping.Patient did have endometrial ablation via her option technique here in our office in 2008 and had done well until recently. Patient presented to the office today July 17 for a sonohysterogram for possible hysterectomy versus removal of any possible polyp or fibroid if identified.  Ultrasound today: Uterus measured 10.8 x 6.8 x 5.7 cm with endometrial stripe of 16.1 (last menstrual period 2 weeks ago). Left ovary normal right ovary echo free thinwall avascular cyst measuring 2.9 x 1.8 mm. No free fluid seen. The cervix was cleansed with Betadine solution and a sterile catheter was introduced into the uterine cavity and a fundal polyp measured 15 x 11 mm was noted.  Patient will be scheduled for resectoscopic polypectomy/ D&C/endometrial ablation   Pertinent Gynecological History: Menses: Heavy lasting 7-10 days Bleeding: Same as above Contraception: vasectomym DES exposure: unknown Blood transfusions: none Sexually transmitted diseases: no past history Previous GYN Procedures: Endometrial ablation  Last mammogram: normal Date:  2015 Last pap: abnormal: ASCUS negative HPV Date: 36 OB History: G 4, P 4   Menstrual History: Menarche age: 35 No LMP recorded.    Past Medical History  Diagnosis Date  . Osteoarthritis   . Hyperlipidemia   . High risk HPV infection     Past Surgical History  Procedure Laterality Date  . Endometrial ablation  12/08/2006    HER OPTION    Family History  Problem Relation Age of Onset  . Hypertension Mother   . Diabetes Father     Social History:  reports that she has never smoked. She has never used smokeless tobacco. She reports that she drinks alcohol. She reports that she does not use  illicit drugs.  Allergies: No Known Allergies   (Not in a hospital admission)  REVIEW OF SYSTEMS: A ROS was performed and pertinent positives and negatives are included in the history.  GENERAL: No fevers or chills. HEENT: No change in vision, no earache, sore throat or sinus congestion. NECK: No pain or stiffness. CARDIOVASCULAR: No chest pain or pressure. No palpitations. PULMONARY: No shortness of breath, cough or wheeze. GASTROINTESTINAL: No abdominal pain, nausea, vomiting or diarrhea, melena or bright red blood per rectum. GENITOURINARY: No urinary frequency, urgency, hesitancy or dysuria. MUSCULOSKELETAL: No joint or muscle pain, no back pain, no recent trauma. DERMATOLOGIC: No rash, no itching, no lesions. ENDOCRINE: No polyuria, polydipsia, no heat or cold intolerance. No recent change in weight. HEMATOLOGICAL: No anemia or easy bruising or bleeding. NEUROLOGIC: No headache, seizures, numbness, tingling or weakness. PSYCHIATRIC: No depression, no loss of interest in normal activity or change in sleep pattern.     There were no vitals taken for this visit.  Physical Exam:  HEENT:unremarkable Neck:Supple, midline, no thyroid megaly, no carotid bruits Lungs:  Clear to auscultation no rhonchi's or wheezes Heart:Regular rate and rhythm, no murmurs or gallops Breast Exam: Symmetrical in appearance no couple masses or tenderness examined on June 15 Abdomen: Same as above Pelvic:BUS within normal limits Vagina: No lesions or discharge some menstrual blood Cervix: No Uterus: Anteverted normal size shape and consistency Adnexa: No palpable mass or tenderness Extremities: No cords, no edema Rectal: Not examined   Assessment/Plan: Patient with menorrhagia. Patient with prior history of Her Option endometrial ablation  over 5 years ago. Sonohysterogram demonstrated endometrial polyp. Patient will be scheduled for resectoscopic polypectomy and D&C along with endometrial ablation. The risk  of surgery were discussed as follows:                        Patient was counseled as to the risk of surgery to include the following:  1. Infection (prohylactic antibiotics will be administered)  2. DVT/Pulmonary Embolism (prophylactic pneumo compression stockings will be used)  3.Trauma to internal organs requiring additional surgical procedure to repair any injury to     Internal organs requiring perhaps additional hospitalization days.  4.Hemmorhage requiring transfusion and blood products which carry risks such as  anaphylactic reaction, hepatitis and AIDS  Patient had received literature information on the procedure scheduled and all her questions were answered and fully accepts all risk.   [facility] HMD11:11 AMTD@Note : This dictation was prepared with  Dragon/digital dictation along withSmart phrase technology. Any transcriptional errors that result from this process are unintentional.      [name] 08/09/2013, 10:53 AM  Note: This dictation was prepared with  Dragon/digital dictation along withSmart phrase technology. Any transcriptional errors that result from this process are unintentional.

## 2013-08-21 NOTE — Anesthesia Procedure Notes (Signed)
Procedure Name: LMA Insertion Date/Time: 08/21/2013 1:10 PM Performed by: Riyanshi Wahab, Sheron Nightingale Pre-anesthesia Checklist: Patient identified, Patient being monitored, Emergency Drugs available, Suction available and Timeout performed Patient Re-evaluated:Patient Re-evaluated prior to inductionOxygen Delivery Method: Circle system utilized Preoxygenation: Pre-oxygenation with 100% oxygen Intubation Type: IV induction LMA: LMA inserted LMA Size: 4.0 Number of attempts: 1 Dental Injury: Teeth and Oropharynx as per pre-operative assessment

## 2013-08-21 NOTE — Anesthesia Preprocedure Evaluation (Addendum)
Anesthesia Evaluation  Patient identified by MRN, date of birth, ID band Patient awake    Reviewed: Allergy & Precautions, H&P , Patient's Chart, lab work & pertinent test results, reviewed documented beta blocker date and time   History of Anesthesia Complications Negative for: history of anesthetic complications  Airway Mallampati: I TM Distance: >3 FB Neck ROM: full    Dental   Pulmonary  breath sounds clear to auscultation        Cardiovascular Exercise Tolerance: Good Rhythm:regular Rate:Normal  hyperlipidemia   Neuro/Psych negative psych ROS   GI/Hepatic   Endo/Other  BMI 34  Renal/GU   Female GU complaint     Musculoskeletal   Abdominal   Peds  Hematology  (+) anemia ,   Anesthesia Other Findings   Reproductive/Obstetrics                          Anesthesia Physical Anesthesia Plan  ASA: II  Anesthesia Plan: General LMA   Post-op Pain Management:    Induction:   Airway Management Planned:   Additional Equipment:   Intra-op Plan:   Post-operative Plan:   Informed Consent: I have reviewed the patients History and Physical, chart, labs and discussed the procedure including the risks, benefits and alternatives for the proposed anesthesia with the patient or authorized representative who has indicated his/her understanding and acceptance.   Dental Advisory Given  Plan Discussed with: CRNA, Surgeon and Anesthesiologist  Anesthesia Plan Comments:         Anesthesia Quick Evaluation

## 2013-08-21 NOTE — Anesthesia Postprocedure Evaluation (Signed)
  Anesthesia Post-op Note  Anesthesia Post Note  Patient: Gabrielle Cameron [facility]  Procedure(s) Performed: Procedure(s) (LRB): DILATATION & CURETTAGE/HYSTEROSCOPY WITH MYOSURE ABLATION WITH ROLLER BAR ENDO ABLATION (N/A)  Anesthesia type: General  Patient location: PACU  Post pain: Pain level controlled  Post assessment: Post-op Vital signs reviewed  Last Vitals:  Filed Vitals:   08/21/13 1538  BP:   Pulse: 89  Temp:   Resp: 20    Post vital signs: Reviewed  Level of consciousness: sedated  Complications: No apparent anesthesia complications

## 2013-08-21 NOTE — Transfer of Care (Signed)
Immediate Anesthesia Transfer of Care Note  Patient: Gabrielle Cameron [facility]  Procedure(s) Performed: Procedure(s): DILATATION & CURETTAGE/HYSTEROSCOPY WITH MYOSURE ABLATION WITH ROLLER BAR ENDO ABLATION (N/A)  Patient Location: PACU  Anesthesia Type:General  Level of Consciousness: awake, alert  and oriented  Airway & Oxygen Therapy: Patient Spontanous Breathing and Patient connected to nasal cannula oxygen  Post-op Assessment: Report given to PACU RN and Post -op Vital signs reviewed and stable  Post vital signs: Reviewed and stable  Complications: No apparent anesthesia complications

## 2013-08-21 NOTE — Interval H&P Note (Signed)
History and Physical Interval Note:  08/21/2013 12:50 PM  Gabrielle Cameron  has presented today for surgery, with the diagnosis of menorrhagia  The various methods of treatment have been discussed with the patient and family. After consideration of risks, benefits and other options for treatment, the patient has consented to  Procedure(s): [place] (N/A) as a surgical intervention .  The patient's history has been reviewed, patient examined, no change in status, stable for surgery.  I have reviewed the patient's chart and labs.  Questions were answered to the patient's satisfaction.     [name]

## 2013-09-05 ENCOUNTER — Ambulatory Visit (INDEPENDENT_AMBULATORY_CARE_PROVIDER_SITE_OTHER): Payer: Self-pay | Admitting: Gynecology

## 2013-09-05 ENCOUNTER — Encounter: Payer: Self-pay | Admitting: Gynecology

## 2013-09-05 VITALS — BP 124/80

## 2013-09-05 DIAGNOSIS — Z4889 Encounter for other specified surgical aftercare: Secondary | ICD-10-CM

## 2013-09-05 NOTE — Progress Notes (Signed)
   Patient presented to the office today for 2 week postop visit. Patient is status post D&C, resectoscopic polypectomy, endometrial ablation as a result of the patient's menorrhagia. Patient is doing well has some spotting is to be expected. Pictures from her surgery were shared with the patient as well as the following pathology report discussed:  Diagnosis Endometrial polyp - SECRETORY ENDOMETRIUM, BENIGN ENDOMETRIAL POLYP AND BENIGN MYOMETRIUM. NO HYPERPLASIA OR CARCINOMA.  Exam: Abdomen: Soft nontender no rebound guarding Pelvic: Bartholin urethra Skene was within normal limits Vagina: No lesion or discharge Cervix: No lesions or discharge Uterus: Anteverted normal size shape and consistency nontender Adnexa: No palpable mass or tenderness Rectal exam: Not done  Assessment/plan: Patient 2 weeks status post D&C, resectoscopic polypectomy and endometrial ablation as a result of her menorrhagia is doing well. Patient may resume full normal activity and we will see her back next year for annual exam or when necessary.

## 2013-10-02 ENCOUNTER — Encounter: Payer: Self-pay | Admitting: Gynecology

## 2013-10-02 ENCOUNTER — Ambulatory Visit (INDEPENDENT_AMBULATORY_CARE_PROVIDER_SITE_OTHER): Payer: Self-pay | Admitting: Gynecology

## 2013-10-02 VITALS — BP 130/80

## 2013-10-02 DIAGNOSIS — Z113 Encounter for screening for infections with a predominantly sexual mode of transmission: Secondary | ICD-10-CM

## 2013-10-02 DIAGNOSIS — N898 Other specified noninflammatory disorders of vagina: Secondary | ICD-10-CM

## 2013-10-02 LAB — WET PREP FOR TRICH, YEAST, CLUE
Clue Cells Wet Prep HPF POC: NONE SEEN
Trich, Wet Prep: NONE SEEN
YEAST WET PREP: NONE SEEN

## 2013-10-02 MED ORDER — LIDOCAINE 5 % EX OINT: 1.0000 "application " | TOPICAL_OINTMENT | CUTANEOUS | Status: DC | PRN

## 2013-10-02 NOTE — Progress Notes (Signed)
   Patient presented to the office complaining of some vaginal irritation and tenderness to the point that she cannot have intercourse the night before. Patient was seen in the office on August 13 for her 2  week postop visit she was status post resectoscopic polypectomy as well as endometrial ablation. Her pathology returned demonstrating benign endometrial polyp and benign myometrium and no hyperplasia or malignancy. Patient states that she is in a monogamous relationship.  Exam: Bartholin urethra Skene was within normal limits Vagina: Clear discharge was noted no odor Cervix: No lesions or discharge Bimanual exam unremarkable Rectal exam: Not done  Wet prep essentially negative  GC and Chlamydia culture obtained results pending at time of this dictation  Assessment/plan: Possibly irritation may be due to regeneration of the endometrium after her recent endometrial ablation causing this watery discharge. She was reassured. She'll be prescribed lidocaine gel to apply topically when necessary. Results of GC Chlamydia culture will be shared with the patient if there is any abnormality.

## 2013-10-03 LAB — GC/CHLAMYDIA PROBE AMP
CT PROBE, AMP APTIMA: NEGATIVE
GC Probe RNA: NEGATIVE

## 2013-10-04 ENCOUNTER — Other Ambulatory Visit: Payer: Self-pay | Admitting: Gynecology

## 2013-10-04 ENCOUNTER — Telehealth: Payer: Self-pay

## 2013-10-04 MED ORDER — LIDOCAINE HCL 2 % EX GEL: 1.0000 "application " | CUTANEOUS | Status: DC | PRN

## 2013-10-04 NOTE — Telephone Encounter (Signed)
2 % xylocaine will be fine

## 2013-10-04 NOTE — Telephone Encounter (Signed)
I received a note from [place] :    "JF prescribe lidocaine (XYLOCAINE) 5 % ointment but it cost $305 at [facility]. Patient wants to know if he can prescribe something cheaper. If there is not a cheaper option patient wants RX send to [facility] to see if its cheaper. Thx "  I checked with [facility] regarding less expensive options and it is less but most would have to be ordered and not in until next week. The pharmacist does have Xylocaine 2% Gel 30 Gram Tube on the shelf and it would cost patient $19.76.  Would that be an option for patient?

## 2013-10-07 ENCOUNTER — Other Ambulatory Visit: Payer: Self-pay | Admitting: *Deleted

## 2013-10-07 MED ORDER — LIDOCAINE HCL 2 % EX GEL: 1.0000 "application " | CUTANEOUS | Status: DC | PRN

## 2013-11-25 ENCOUNTER — Encounter: Payer: Self-pay | Admitting: Gynecology

## 2014-05-19 ENCOUNTER — Other Ambulatory Visit: Payer: Self-pay | Admitting: Gynecology

## 2014-07-07 ENCOUNTER — Other Ambulatory Visit: Payer: Self-pay

## 2014-07-10 ENCOUNTER — Encounter: Payer: Self-pay | Admitting: Gynecology

## 2014-07-24 ENCOUNTER — Ambulatory Visit (INDEPENDENT_AMBULATORY_CARE_PROVIDER_SITE_OTHER): Payer: Self-pay | Admitting: Gynecology

## 2014-07-24 ENCOUNTER — Telehealth: Payer: Self-pay | Admitting: *Deleted

## 2014-07-24 ENCOUNTER — Encounter: Payer: Self-pay | Admitting: Gynecology

## 2014-07-24 ENCOUNTER — Other Ambulatory Visit (HOSPITAL_COMMUNITY)
Admission: RE | Admit: 2014-07-24 | Discharge: 2014-07-24 | Disposition: A | Discharge status: Home / Self Care | Payer: MEDICAID | Source: Ambulatory Visit | Attending: Gynecology | Admitting: Gynecology

## 2014-07-24 VITALS — BP 128/86 | Ht 64.0 in | Wt 205.0 lb

## 2014-07-24 DIAGNOSIS — R635 Abnormal weight gain: Secondary | ICD-10-CM

## 2014-07-24 DIAGNOSIS — R896 Abnormal cytological findings in specimens from other organs, systems and tissues: Secondary | ICD-10-CM

## 2014-07-24 DIAGNOSIS — R1011 Right upper quadrant pain: Secondary | ICD-10-CM | POA: Insufficient documentation

## 2014-07-24 DIAGNOSIS — E785 Hyperlipidemia, unspecified: Secondary | ICD-10-CM

## 2014-07-24 DIAGNOSIS — IMO0002 Reserved for concepts with insufficient information to code with codable children: Secondary | ICD-10-CM

## 2014-07-24 DIAGNOSIS — Z1151 Encounter for screening for human papillomavirus (HPV): Secondary | ICD-10-CM | POA: Insufficient documentation

## 2014-07-24 DIAGNOSIS — Z01419 Encounter for gynecological examination (general) (routine) without abnormal findings: Secondary | ICD-10-CM

## 2014-07-24 LAB — COMPREHENSIVE METABOLIC PANEL
ALK PHOS: 57 U/L (ref 39–117)
ALT: 13 U/L (ref 0–35)
AST: 18 U/L (ref 0–37)
Albumin: 4 g/dL (ref 3.5–5.2)
BILIRUBIN TOTAL: 0.5 mg/dL (ref 0.2–1.2)
BUN: 9 mg/dL (ref 6–23)
CO2: 23 mEq/L (ref 19–32)
Calcium: 8.7 mg/dL (ref 8.4–10.5)
Chloride: 106 mEq/L (ref 96–112)
Creat: 0.61 mg/dL (ref 0.50–1.10)
Glucose, Bld: 87 mg/dL (ref 70–99)
Potassium: 3.7 mEq/L (ref 3.5–5.3)
SODIUM: 139 meq/L (ref 135–145)
TOTAL PROTEIN: 6.6 g/dL (ref 6.0–8.3)

## 2014-07-24 LAB — CBC WITH DIFFERENTIAL/PLATELET
BASOS PCT: 0 % (ref 0–1)
Basophils Absolute: 0 10*3/uL (ref 0.0–0.1)
Eosinophils Absolute: 0.1 10*3/uL (ref 0.0–0.7)
Eosinophils Relative: 1 % (ref 0–5)
HCT: 33.7 % — ABNORMAL LOW (ref 36.0–46.0)
HEMOGLOBIN: 11 g/dL — AB (ref 12.0–15.0)
Lymphocytes Relative: 37 % (ref 12–46)
Lymphs Abs: 2.3 10*3/uL (ref 0.7–4.0)
MCH: 24 pg — ABNORMAL LOW (ref 26.0–34.0)
MCHC: 32.6 g/dL (ref 30.0–36.0)
MCV: 73.4 fL — AB (ref 78.0–100.0)
MPV: 10.2 fL (ref 8.6–12.4)
Monocytes Absolute: 0.5 10*3/uL (ref 0.1–1.0)
Monocytes Relative: 8 % (ref 3–12)
NEUTROS ABS: 3.3 10*3/uL (ref 1.7–7.7)
Neutrophils Relative %: 54 % (ref 43–77)
PLATELETS: 262 10*3/uL (ref 150–400)
RBC: 4.59 MIL/uL (ref 3.87–5.11)
RDW: 17.8 % — ABNORMAL HIGH (ref 11.5–15.5)
WBC: 6.2 10*3/uL (ref 4.0–10.5)

## 2014-07-24 LAB — LIPID PANEL
CHOLESTEROL: 176 mg/dL (ref 0–200)
HDL: 43 mg/dL — ABNORMAL LOW (ref >=46)
LDL Cholesterol: 97 mg/dL (ref 0–99)
Total CHOL/HDL Ratio: 4.1 Ratio
Triglycerides: 179 mg/dL — ABNORMAL HIGH (ref <150)
VLDL: 36 mg/dL (ref 0–40)

## 2014-07-24 LAB — TSH: TSH: 2.425 u[IU]/mL (ref 0.350–4.500)

## 2014-07-24 MED ORDER — ATORVASTATIN CALCIUM 10 MG PO TABS: 10.0000 mg | ORAL_TABLET | Freq: Every day | ORAL | Status: DC

## 2014-07-24 NOTE — Telephone Encounter (Signed)
Appointment on 07/30/14 @ 8:45 check in at [facility] hospital, npo after midnight. Rosemarie Ax will relay

## 2014-07-24 NOTE — Progress Notes (Signed)
Gabrielle Cameron [facility] [birth year] [id number]   History:    44 y.o.  for annual gyn exam who for several months has not been complaining of right upper quadrant pains on and off she has noted with some types of foods. She denies any change in bowel movement habits or color texture. She is on Lipitor for hyperlipidemia last year there was a compliance issue. She states that she ran out of her medication about a month and a half ago so she has been off of it. In August 2015 patient had a resectoscopic polypectomy and endometrial ablation and is having very light normal menstrual cycles. Her husband has had a vasectomy. 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 SEE COMMENT. - BENIGN ENDOCERVICAL MUCOSA. - SCANT FRAGMENTS OF BENIGN PROLIFERATIVE PHASE ENDOMETRIUM. 2. Cervix, biopsy, 4 o'clock - LOW GRADE SQUAMOUS INTRAEPITHELIAL LESION, CIN-I (MILD DYSPLASIA). SEE COMMENT. 3. Cervix, biopsy, 12 o'clock - LOW GRADE SQUAMOUS INTRAEPITHELIAL LESION, CIN-I (MILD DYSPLASIA). SEE COMMENT. Microscopic Comment 1. 2, and 3: The findings correlate with the previous Pap test results ([id number]).  Patient's Pap smear last year demonstrated atypical squamous cells of undetermined significance but negative HPV  Past medical history,surgical history, family history and social history were all reviewed and documented in the EPIC chart.  Gynecologic History Patient's last menstrual period was  07/04/2014. Contraception: vasectomy Last Pap: 2015. Results were: ASCUS negative HPV Last mammogram: 2015. Results were: normal  Obstetric History OB History  Gravida Para Term Preterm AB SAB TAB Ectopic Multiple Living  4 4 4       4     # Outcome Date GA Lbr Len/2nd Weight Sex Delivery Anes PTL Lv  4 Term     F Vag-Spont  N Y  3 Term     F Vag-Spont  N Y  2 Term     F Vag-Spont  N Y  1 Term     F Vag-Spont  N Y       ROS: A ROS was performed and pertinent positives and negatives are included in the history.  GENERAL: No fevers or chills. HEENT: No change in vision, no earache, sore throat or sinus congestion. NECK: No pain or stiffness. CARDIOVASCULAR: No chest pain or pressure. No palpitations. PULMONARY: No shortness of breath, cough or wheeze. GASTROINTESTINAL: No abdominal pain, nausea, vomiting or diarrhea, melena or bright red blood per rectum. GENITOURINARY: No urinary frequency, urgency, hesitancy or dysuria. MUSCULOSKELETAL: No joint or muscle pain, no back pain, no recent trauma. DERMATOLOGIC: No rash, no itching, no lesions. ENDOCRINE: No polyuria, polydipsia, no heat or cold intolerance. No recent change in weight. HEMATOLOGICAL: No anemia or easy bruising or bleeding. NEUROLOGIC: No headache, seizures, numbness, tingling or weakness. PSYCHIATRIC: No depression, no loss of interest in normal activity or change in sleep pattern.     Exam: chaperone present  BP 128/86 mmHg  Ht 5\' 4"  (1.626 m)  Wt 205 lb (92.987 kg)  BMI 35.17 kg/m2  LMP 07/04/2014  Body mass index is 35.17 kg/(m^2).  General appearance : Well developed well nourished female. No acute distress HEENT: Eyes: no retinal hemorrhage or exudates,  Neck supple, trachea midline, no carotid bruits, no thyroidmegaly Lungs: Clear to auscultation, no rhonchi or wheezes, or rib retractions  Heart: Regular rate and rhythm, no murmurs or gallops Breast:Examined in sitting and supine position were symmetrical in  appearance, no palpable masses or tenderness,  no skin retraction, no nipple inversion, no nipple discharge, no skin discoloration, no axillary or supraclavicular lymphadenopathy Abdomen: no palpable masses or tenderness, no rebound or guarding Extremities: no edema or skin discoloration or tenderness  Pelvic:  Bartholin, Urethra, Skene Glands: Within normal limits             Vagina: No gross lesions or discharge  Cervix: No gross lesions or discharge, friable  Uterus  anteverted, normal size, shape and consistency, non-tender and mobile  Adnexa  Without masses or tenderness  Anus and perineum  normal   Rectovaginal  normal sphincter tone without palpated masses or tenderness             Hemoccult not indicated     Assessment/Plan:  44 y.o. female for annual exam who last year had atypical squamous cells of undetermined significance on Pap smear with negative HPV. Pap smear with HPV screening done today. Because of patient's right upper quadrant discomfort and upper abdominal ultrasound will be ordered to rule out cholelithiasis. We discussed importance of low fat diet because of her history of hyperlipidemia. She was given her prescription of her Lipitor 10 mg to take 1 by mouth daily which she had ran out. The following screening blood work was ordered today: Fasting lipid profile, comprehensive metabolic panel, TSH, CBC, and urinalysis. Patient scheduled for mammogram the end of next month.   [name] MD, 8:36 AM 07/24/2014

## 2014-07-24 NOTE — Telephone Encounter (Signed)
-----   Message from [name], MD sent at 07/24/2014  8:24 AM EDT ----- Please schedule upper abdominal u/s on this patient with RUQ pains to r/o cholelethiasis

## 2014-07-25 ENCOUNTER — Other Ambulatory Visit: Payer: Self-pay | Admitting: Gynecology

## 2014-07-25 DIAGNOSIS — E7800 Pure hypercholesterolemia, unspecified: Secondary | ICD-10-CM

## 2014-07-25 DIAGNOSIS — D649 Anemia, unspecified: Secondary | ICD-10-CM

## 2014-07-25 LAB — URINALYSIS W MICROSCOPIC + REFLEX CULTURE
BACTERIA UA: NONE SEEN
Bilirubin Urine: NEGATIVE
CRYSTALS: NONE SEEN
Casts: NONE SEEN
Glucose, UA: NEGATIVE mg/dL
HGB URINE DIPSTICK: NEGATIVE
Ketones, ur: NEGATIVE mg/dL
LEUKOCYTES UA: NEGATIVE
Nitrite: NEGATIVE
PH: 7.5 (ref 5.0–8.0)
PROTEIN: NEGATIVE mg/dL
Specific Gravity, Urine: <1.005 — ABNORMAL LOW (ref 1.005–1.030)
UROBILINOGEN UA: 0.2 mg/dL (ref 0.0–1.0)

## 2014-07-25 LAB — CYTOLOGY - PAP

## 2014-07-30 ENCOUNTER — Ambulatory Visit (HOSPITAL_COMMUNITY)
Admission: RE | Admit: 2014-07-30 | Discharge: 2014-07-30 | Disposition: A | Discharge status: Home / Self Care | Payer: Self-pay | Source: Ambulatory Visit | Attending: Gynecology | Admitting: Gynecology

## 2014-07-30 DIAGNOSIS — R1011 Right upper quadrant pain: Secondary | ICD-10-CM | POA: Insufficient documentation

## 2014-08-20 ENCOUNTER — Other Ambulatory Visit: Payer: Self-pay

## 2014-08-20 DIAGNOSIS — Z1231 Encounter for screening mammogram for malignant neoplasm of breast: Secondary | ICD-10-CM

## 2014-09-03 ENCOUNTER — Ambulatory Visit: Payer: Self-pay

## 2014-09-09 ENCOUNTER — Ambulatory Visit
Admission: RE | Admit: 2014-09-09 | Discharge: 2014-09-09 | Disposition: A | Discharge status: Home / Self Care | Payer: No Typology Code available for payment source | Source: Ambulatory Visit

## 2014-09-09 DIAGNOSIS — Z1231 Encounter for screening mammogram for malignant neoplasm of breast: Secondary | ICD-10-CM

## 2014-10-27 ENCOUNTER — Other Ambulatory Visit: Payer: Self-pay

## 2014-10-27 DIAGNOSIS — D649 Anemia, unspecified: Secondary | ICD-10-CM

## 2014-10-27 DIAGNOSIS — E7800 Pure hypercholesterolemia, unspecified: Secondary | ICD-10-CM

## 2014-10-27 LAB — CBC
HCT: 35.6 % — ABNORMAL LOW (ref 36.0–46.0)
Hemoglobin: 12.2 g/dL (ref 12.0–15.0)
MCH: 26.3 pg (ref 26.0–34.0)
MCHC: 34.3 g/dL (ref 30.0–36.0)
MCV: 76.9 fL — ABNORMAL LOW (ref 78.0–100.0)
MPV: 10.4 fL (ref 8.6–12.4)
PLATELETS: 243 10*3/uL (ref 150–400)
RBC: 4.63 MIL/uL (ref 3.87–5.11)
RDW: 15.8 % — ABNORMAL HIGH (ref 11.5–15.5)
WBC: 6.9 10*3/uL (ref 4.0–10.5)

## 2014-10-27 LAB — LIPID PANEL
CHOL/HDL RATIO: 3.8 ratio (ref <=5.0)
CHOLESTEROL: 151 mg/dL (ref 125–200)
HDL: 40 mg/dL — AB (ref >=46)
LDL Cholesterol: 82 mg/dL (ref <130)
Triglycerides: 144 mg/dL (ref <150)
VLDL: 29 mg/dL (ref <30)

## 2015-03-02 ENCOUNTER — Other Ambulatory Visit (HOSPITAL_COMMUNITY): Payer: Self-pay | Admitting: Orthopedic Surgery

## 2015-03-02 ENCOUNTER — Other Ambulatory Visit: Payer: Self-pay | Admitting: Orthopedic Surgery

## 2015-03-02 DIAGNOSIS — M545 Low back pain: Secondary | ICD-10-CM

## 2015-03-16 ENCOUNTER — Ambulatory Visit (HOSPITAL_COMMUNITY)
Admission: RE | Admit: 2015-03-16 | Discharge: 2015-03-16 | Disposition: A | Discharge status: Home / Self Care | Payer: No Typology Code available for payment source | Source: Ambulatory Visit | Attending: Orthopedic Surgery | Admitting: Orthopedic Surgery

## 2015-03-16 DIAGNOSIS — M5136 Other intervertebral disc degeneration, lumbar region: Secondary | ICD-10-CM | POA: Insufficient documentation

## 2015-03-16 DIAGNOSIS — M438X4 Other specified deforming dorsopathies, thoracic region: Secondary | ICD-10-CM | POA: Insufficient documentation

## 2015-03-16 DIAGNOSIS — M438X6 Other specified deforming dorsopathies, lumbar region: Secondary | ICD-10-CM | POA: Insufficient documentation

## 2015-03-16 DIAGNOSIS — M545 Low back pain: Secondary | ICD-10-CM

## 2015-03-16 DIAGNOSIS — M5127 Other intervertebral disc displacement, lumbosacral region: Secondary | ICD-10-CM | POA: Insufficient documentation

## 2015-05-13 ENCOUNTER — Encounter: Payer: Self-pay | Admitting: Gynecology

## 2015-05-13 ENCOUNTER — Ambulatory Visit (INDEPENDENT_AMBULATORY_CARE_PROVIDER_SITE_OTHER): Payer: Self-pay | Admitting: Gynecology

## 2015-05-13 DIAGNOSIS — F32A Depression, unspecified: Secondary | ICD-10-CM | POA: Insufficient documentation

## 2015-05-13 DIAGNOSIS — F329 Major depressive disorder, single episode, unspecified: Secondary | ICD-10-CM

## 2015-05-13 DIAGNOSIS — N898 Other specified noninflammatory disorders of vagina: Secondary | ICD-10-CM

## 2015-05-13 DIAGNOSIS — Z113 Encounter for screening for infections with a predominantly sexual mode of transmission: Secondary | ICD-10-CM

## 2015-05-13 LAB — WET PREP FOR TRICH, YEAST, CLUE
Clue Cells Wet Prep HPF POC: NONE SEEN
TRICH WET PREP: NONE SEEN
YEAST WET PREP: NONE SEEN

## 2015-05-13 MED ORDER — PAROXETINE HCL ER 12.5 MG PO TB24: 12.5000 mg | ORAL_TABLET | ORAL | Status: DC

## 2015-05-13 NOTE — Patient Instructions (Signed)
Paroxetine Controlled-Release Tablets Qu es este medicamento? La PAROXETINA se utiliza para tratar la depresin. Este medicamento tambin puede ayudar a Leisure centre manager con trastornos de ansiedad, trastorno obsesivo-compulsivo, ataques de pnico, estrs postraumtico o trastorno disfrico premenstrual (TDPM). Este medicamento puede ser utilizado para otros usos; si tiene alguna pregunta consulte con su proveedor de atencin mdica o con su farmacutico. Qu le debo informar a mi profesional de la salud antes de tomar este medicamento? Necesita saber si usted presenta alguno de los [employer] o situaciones: -trastornos de hemorragias -glaucoma -enfermedad cardiaca -enfermedad renal -enfermedad heptica -bajos niveles de sodio en la sangre -mana o trastorno bipolar -convulsiones -ideas suicidas, planes o intento; si usted o alguien de su familia ha intentado un suicidio previo -si ha tomado un IMAO, tales como [place], Eldepryl, Marplan, Nardil o Parnate -si toma medicamentos que tratan o previenen cogulos sanguneos -una reaccin alrgica o inusual a la paroxetina, a otros medicamentos, alimentos, colorantes o conservadores -si est embarazada o buscando quedar embarazada -si est amamantando a un beb Cmo debo utilizar este medicamento? Tome este medicamento por va oral con un vaso de agua. Siga las instrucciones de la etiqueta del [place]. Este medicamento se puede tomar con o sin alimentos. No triture ni [employer]. Tome sus dosis a intervalos regulares. No tome su medicamento con una frecuencia mayor a la indicada. No deje de tomar [employer] repentinamente a menos que as indique su mdico. El detener este medicamento demasiado rpido puede causar efectos secundarios graves o puede empeorar su condicin. Su farmacutico le dar una gua del medicamento especial con cada receta y relleno. Asegrese de leer esta informacin cada vez  cuidadosamente. Hable con su pediatra para informarse acerca del uso de este medicamento en nios. Puede requerir atencin especial. Sobredosis: Pngase en contacto inmediatamente con un centro toxicolgico o una sala de urgencia si usted cree que haya tomado demasiado medicamento. ATENCIN: [employer] es solo para usted. No comparta este medicamento con nadie. Qu sucede si me olvido de una dosis? Si olvida una dosis, tmela lo antes posible. Si es casi la hora de la prxima dosis, tome slo esa dosis. No tome dosis adicionales o dobles. Qu puede interactuar con este medicamento? No tome esta medicina con ninguno de los siguientes medicamentos: -linezolid -IMAOs, tales como Carbex, Eldepryl, Marplan, Nardil y Parnate -azul de metileno (Mining engineer) -pimozida -tioridazina Esta medicina tambin puede interactuar con los siguientes medicamentos: -alcohol -anticidos -aspirina o medicamentos tipo aspirina -atomoxetina -ciertos medicamentos para la depresin, ansiedad o trastornos psicticos -ciertos medicamentos para el ritmo cardaco irregular, tales como propafenona, flecainida, encainida, y quinidina -ciertos medicamentos para migraas, tales como almotriptn, eletriptn, frovatriptn, naratriptn, riztriptn, sumatriptn, zolmitriptn -cimetidina -digoxina -diurticos -fentanilo -fosamprenavir/ritonavir -furazolidona -isoniazida -litio -medicamentos que tratan o previenen cogulos sanguneos, tales como warfarina, enoxaparina y dalteparina -medicamentos para conciliar el sueo -metoprolol -los Powers, medicamentos para el dolor o inflamacin, tales como ibuprofeno o naproxeno -fenobarbital -fenitona -procarbazina -prociclidina -rasagilina -suplementos como hierba de [place], kava kava, valeriana -tamoxifeno -teofilina -tramadol -triptfano Puede ser que esta lista no menciona todas las posibles interacciones. Informe a su profesional de [employer]	[place] de  [employer] productos a base de hierbas, medicamentos de [place] o suplementos nutritivos que est tomando. Si usted fuma, consume bebidas alcohlicas o si utiliza drogas ilegales, indqueselo tambin a su profesional de [employer]	[place]. Algunas sustancias pueden interactuar con su medicamento. A qu debo estar atento al usar Coca[employer]Cola? Informe a su mdico si sus sntomas no  mejoran o si empeoran. Visite a su mdico o a su profesional de la salud para chequear su evolucin peridicamente. Debido que puede ser necesario tomar este medicamento durante varias semanas para que sea posible observar sus efectos en forma [place], es importante que sigue su tratamiento como recetado por su mdico. Los pacientes y sus familias deben estar atentos si empeora la depresin o ideas suicidas. Tambin est atento a cambios repentinos o severos de [employer] sentirse ansioso, agitado, lleno de pnico, irritable, hostil, agresivo, impulsivo, inquietud severa, demasiado excitado y hiperactivo o dificultad para conciliar el sueo. Si esto ocurre, especialmente al comenzar con el tratamiento de un antidepresivo o al cambiar de dosis, comunquese con su profesional de [employer]	[place]. Puede experimentar somnolencia o mareos. No conduzca ni utilice maquinaria, ni haga nada que Associate Professor en estado de alerta hasta que sepa cmo le afecta este medicamento. No se siente ni se ponga de pie con rapidez, especialmente si es un paciente de edad avanzada. Esto reduce el riesgo de mareos o [employer]. El alcohol puede interferir con el efecto de este medicamento. Evite consumir bebidas alcohlicas. Se le podr secar la boca. Masticar chicle sin azcar, chupar caramelos duros y beber agua en abundancia le ayudar a mantener la boca hmeda. Qu efectos secundarios puedo tener al [employer] este medicamento? Efectos secundarios que debe informar a su mdico o a Barrister's clerk de la salud tan pronto como sea posible: -Ecologist como erupcin cutnea, picazn o urticarias, hinchazn de la cara, labios o lengua -heces de color oscuro o de aspecto alquitranado, sangre el la orina o vomito -pulso cardiaco rpido, irregular -alucinaciones, prdida del contacto con la realidad -ereccin dolorosa o prolongada (en los hombres) -convulsiones -ideas suicidas u otros cambios de humor -dificultad para orinar o cambios en el volumen de orina -sangrado, magulladuras inusuales -cansancio o debilidad inusual -vmito Efectos secundarios que, por lo general, no requieren atencin mdica (debe informarlos a su mdico o a su profesional de la salud si persisten o si son molestos): -cambios en el apetito, peso -cambios en el deseo sexual o capacidad -estreimiento o diarrea -dificultad para conciliar el sueo -somnolencia -dolor de cabeza -aumento de la sudoracin -dolor o debilidad muscular -temblores Puede ser que esta lista no menciona todos los posibles efectos secundarios. Comunquese a su mdico por asesoramiento mdico [employer] Usted puede informar los efectos secundarios a la FDA por telfono al 1-800-FDA-1088. Dnde debo guardar mi medicina? Mantngala fuera del alcance de los nios. [place]dela a una temperatura de o menos de 25 grados C (29 grados F). Deseche todo el medicamento que no haya utilizado, despus de la fecha de vencimiento. ATENCIN: Este folleto es un resumen. Puede ser que no cubra toda la posible informacin. Si usted tiene preguntas acerca de esta medicina, consulte con su mdico, su farmacutico o su profesional de Technical sales engineer.    2016, Elsevier/Gold Standard. (2014-03-05 00:00:00)

## 2015-05-13 NOTE — Progress Notes (Signed)
   Patient has a 45 year old that presented to the office today with several complaints. One of her main issue this she is requesting have an STD screen because she feels that her husband may have been unfaithful. She has no true vaginal discharge today but did experience 3 weeks ago right before her menses a greenish-like discharge and she by Monistat ovule which she'll use for 3 days. She also is feeling that because of her concerns of her husband's infidelity she is not sleeping well and also feels depressed that time with anxiety. She has no suicidal ideation. Patient denied any fever, chills, nausea, vomiting or any back pain.  Exam: Gen. appearance patient no acute distress somewhat withdrawn and depressed affect Pelvic: Bartholin urethra Skene was within normal limits Vagina: No lesions or discharge Cervix no lesions or discharge Uterus bimanual exam not done Adnexa: Bimanual exam not done Rectal exam: Not done  Wet prep few bacteria  GC and Chlamydia culture pending  Assessment/plan: Problem #1 patient with depression and anxiety. We discussed different treatment options. Patient not interested in counseling at the present time when offered. She would like to try medical treatment first. We are going to start her on Paxil CR 12.5 mg 1 by mouth every morning. The risks benefits and pros and cons of the medication was provided. Literature information was provided in Spanish and questions were answered. Patient's husband has had a vasectomy. #2 patient with recent vaginal discharge appeared to have cleared requesting an STD screen a GC and chlamydia culture was obtained today along with an HIV, RPR and hepatitis B and C results pending at time of this dictation.

## 2015-05-14 LAB — GC/CHLAMYDIA PROBE AMP
CT Probe RNA: NOT DETECTED
GC PROBE AMP APTIMA: NOT DETECTED

## 2015-05-14 LAB — HEPATITIS B SURFACE ANTIGEN: Hepatitis B Surface Ag: NEGATIVE

## 2015-05-14 LAB — HIV ANTIBODY (ROUTINE TESTING W REFLEX): HIV 1&2 Ab, 4th Generation: NONREACTIVE

## 2015-05-14 LAB — RPR

## 2015-05-14 LAB — HEPATITIS C ANTIBODY: HCV Ab: NEGATIVE

## 2015-07-29 ENCOUNTER — Encounter: Payer: Self-pay | Admitting: Gynecology

## 2015-08-13 ENCOUNTER — Encounter: Payer: Self-pay | Admitting: Gynecology

## 2015-08-13 ENCOUNTER — Ambulatory Visit (INDEPENDENT_AMBULATORY_CARE_PROVIDER_SITE_OTHER): Payer: Self-pay | Admitting: Gynecology

## 2015-08-13 VITALS — BP 124/80 | Ht 62.0 in | Wt 202.0 lb

## 2015-08-13 DIAGNOSIS — Z01419 Encounter for gynecological examination (general) (routine) without abnormal findings: Secondary | ICD-10-CM

## 2015-08-13 DIAGNOSIS — Z87410 Personal history of cervical dysplasia: Secondary | ICD-10-CM

## 2015-08-13 DIAGNOSIS — E785 Hyperlipidemia, unspecified: Secondary | ICD-10-CM

## 2015-08-13 DIAGNOSIS — F329 Major depressive disorder, single episode, unspecified: Secondary | ICD-10-CM

## 2015-08-13 DIAGNOSIS — F32A Depression, unspecified: Secondary | ICD-10-CM

## 2015-08-13 LAB — CBC WITH DIFFERENTIAL/PLATELET
BASOS ABS: 0 {cells}/uL (ref 0–200)
Basophils Relative: 0 %
EOS PCT: 0 %
Eosinophils Absolute: 0 cells/uL — ABNORMAL LOW (ref 15–500)
HCT: 31.9 % — ABNORMAL LOW (ref 35.0–45.0)
Hemoglobin: 10.3 g/dL — ABNORMAL LOW (ref 11.7–15.5)
LYMPHS ABS: 2106 {cells}/uL (ref 850–3900)
Lymphocytes Relative: 27 %
MCH: 23.1 pg — AB (ref 27.0–33.0)
MCHC: 32.3 g/dL (ref 32.0–36.0)
MCV: 71.7 fL — ABNORMAL LOW (ref 80.0–100.0)
MONOS PCT: 7 %
MPV: 10.2 fL (ref 7.5–12.5)
Monocytes Absolute: 546 cells/uL (ref 200–950)
Neutro Abs: 5148 cells/uL (ref 1500–7800)
Neutrophils Relative %: 66 %
PLATELETS: 266 10*3/uL (ref 140–400)
RBC: 4.45 MIL/uL (ref 3.80–5.10)
RDW: 16.8 % — ABNORMAL HIGH (ref 11.0–15.0)
WBC: 7.8 10*3/uL (ref 3.8–10.8)

## 2015-08-13 LAB — TSH: TSH: 2.15 m[IU]/L

## 2015-08-13 MED ORDER — PAROXETINE HCL ER 12.5 MG PO TB24: 12.5000 mg | ORAL_TABLET | ORAL | Status: DC

## 2015-08-13 MED ORDER — ATORVASTATIN CALCIUM 10 MG PO TABS: 10.0000 mg | ORAL_TABLET | Freq: Every day | ORAL | Status: DC

## 2015-08-13 NOTE — Addendum Note (Signed)
Addended by: [name] on: 08/13/2015 09:53 AM   Modules accepted: Orders

## 2015-08-13 NOTE — Addendum Note (Signed)
Addended by: Thurnell Garbe A on: 08/13/2015 09:57 AM   Modules accepted: Orders, SmartSet

## 2015-08-13 NOTE — Addendum Note (Signed)
Addended by: Thurnell Garbe A on: 08/13/2015 09:51 AM   Modules accepted: Orders, SmartSet

## 2015-08-13 NOTE — Addendum Note (Signed)
Addended by: Thurnell Garbe A on: 08/13/2015 11:27 AM   Modules accepted: Orders, Medications, SmartSet

## 2015-08-13 NOTE — Progress Notes (Signed)
Gabrielle Cameron [facility] Dec 05, 1970 [id number]   History:    45 y.o.  for annual gyn exam with no complaints today. Patient with history of hyperlipidemia has done well on Lipitor. She also has had history of depression in the past and much anxiety and has done well in her Paxil as well.In August 2015 patient had a resectoscopic polypectomy and endometrial ablation and is having very light normal menstrual cycles. Her husband has had a vasectomy. 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 SEE COMMENT. - BENIGN ENDOCERVICAL MUCOSA. - SCANT FRAGMENTS OF BENIGN PROLIFERATIVE PHASE ENDOMETRIUM. 2. Cervix, biopsy, 4 o'clock - LOW GRADE SQUAMOUS INTRAEPITHELIAL LESION, CIN-I (MILD DYSPLASIA). SEE COMMENT. 3. Cervix, biopsy, 12 o'clock - LOW GRADE SQUAMOUS INTRAEPITHELIAL LESION, CIN-I (MILD DYSPLASIA). SEE COMMENT. Microscopic Comment 1. 2, and 3: The findings correlate with the previous Pap test results [id number]).  Patient's Pap smear 2015 demonstrated atypical squamous cells of undetermined significance but negative HPV  Pap smear 2016 was normal. Patient reports normal menstrual cycles.    Past medical history,surgical history, family history and social history were all reviewed and documented in the EPIC chart.  Gynecologic History Patient's last menstrual period was 07/28/2015. Contraception: vasectomy Last Pap: 2016. Results were: normal Last mammogram: 2016. Results were:  normal  Obstetric History OB History  Gravida Para Term Preterm AB SAB TAB Ectopic Multiple Living  4 4 4       4     # Outcome Date GA Lbr Len/2nd Weight Sex Delivery Anes PTL Lv  4 Term     F Vag-Spont  N Y  3 Term     F Vag-Spont  N Y  2 Term     F Vag-Spont  N Y  1 Term     F Vag-Spont  N Y       ROS: A ROS was performed and pertinent positives and negatives are included in the history.  GENERAL: No fevers or chills. HEENT: No change in vision, no earache, sore throat or sinus congestion. NECK: No pain or stiffness. CARDIOVASCULAR: No chest pain or pressure. No palpitations. PULMONARY: No shortness of breath, cough or wheeze. GASTROINTESTINAL: No abdominal pain, nausea, vomiting or diarrhea, melena or bright red blood per rectum. GENITOURINARY: No urinary frequency, urgency, hesitancy or dysuria. MUSCULOSKELETAL: No joint or muscle pain, no back pain, no recent trauma. DERMATOLOGIC: No rash, no itching, no lesions. ENDOCRINE: No polyuria, polydipsia, no heat or cold intolerance. No recent change in weight. HEMATOLOGICAL: No anemia or easy bruising or bleeding. NEUROLOGIC: No headache, seizures, numbness, tingling or weakness. PSYCHIATRIC: No depression, no loss of interest in normal activity or change in sleep pattern.     Exam: chaperone present  BP 124/80 mmHg  Ht 5\' 2"  (1.575 m)  Wt 202 lb (91.627 kg)  BMI 36.94 kg/m2  LMP 07/28/2015  Body mass index is 36.94 kg/(m^2).  General appearance : Well developed well nourished female. No acute distress HEENT: Eyes: no retinal hemorrhage or exudates,  Neck supple, trachea midline, no carotid  bruits, no thyroidmegaly Lungs: Clear to auscultation, no rhonchi or wheezes, or rib retractions  Heart: Regular rate and rhythm, no murmurs or gallops Breast:Examined in sitting and supine position were symmetrical in appearance, no palpable masses or tenderness,  no skin retraction, no nipple inversion, no nipple discharge, no skin  discoloration, no axillary or supraclavicular lymphadenopathy Abdomen: no palpable masses or tenderness, no rebound or guarding Extremities: no edema or skin discoloration or tenderness  Pelvic:  Bartholin, Urethra, Skene Glands: Within normal limits             Vagina: No gross lesions or discharge  Cervix: No gross lesions or discharge  Uterus  anteverted, normal size, shape and consistency, non-tender and mobile  Adnexa  Without masses or tenderness  Anus and perineum  normal   Rectovaginal  normal sphincter tone without palpated masses or tenderness             Hemoccult not indicated     Assessment/Plan:  45 y.o. female for annual exam with past history of dysplasia for closer surveillance a Pap smear with HPV screen will be done today. Also fasting blood work done today consisting of fasting lipid profile, comprehensive metabolic panel, TSH, CBC, and urinalysis. Patient was reminded on the importance of monthly breast exam. She is due for her mammogram next month. Prescription refill for her Lipitor and Paxil was provided as well.   [name] MD, 9:41 AM 08/13/2015

## 2015-08-14 LAB — COMPREHENSIVE METABOLIC PANEL
ALT: 12 U/L (ref 6–29)
AST: 18 U/L (ref 10–30)
Albumin: 3.7 g/dL (ref 3.6–5.1)
Alkaline Phosphatase: 64 U/L (ref 33–115)
BUN: 5 mg/dL — ABNORMAL LOW (ref 7–25)
CALCIUM: 8.3 mg/dL — AB (ref 8.6–10.2)
CO2: 22 mmol/L (ref 20–31)
Chloride: 104 mmol/L (ref 98–110)
Creat: 0.59 mg/dL (ref 0.50–1.10)
Glucose, Bld: 102 mg/dL — ABNORMAL HIGH (ref 65–99)
Potassium: 3.7 mmol/L (ref 3.5–5.3)
SODIUM: 138 mmol/L (ref 135–146)
Total Bilirubin: 0.5 mg/dL (ref 0.2–1.2)
Total Protein: 6 g/dL — ABNORMAL LOW (ref 6.1–8.1)

## 2015-08-14 LAB — URINALYSIS W MICROSCOPIC + REFLEX CULTURE
Bacteria, UA: NONE SEEN [HPF]
Bilirubin Urine: NEGATIVE
CASTS: NONE SEEN [LPF]
Crystals: NONE SEEN [HPF]
GLUCOSE, UA: NEGATIVE
HGB URINE DIPSTICK: NEGATIVE
Ketones, ur: NEGATIVE
LEUKOCYTES UA: NEGATIVE
NITRITE: NEGATIVE
PH: 6.5 (ref 5.0–8.0)
Protein, ur: NEGATIVE
RBC / HPF: NONE SEEN RBC/HPF (ref <=2)
Specific Gravity, Urine: 1.004 (ref 1.001–1.035)
Squamous Epithelial / LPF: NONE SEEN [HPF] (ref <=5)
WBC, UA: NONE SEEN WBC/HPF (ref <=5)
YEAST: NONE SEEN [HPF]

## 2015-08-14 LAB — LIPID PANEL
Cholesterol: 139 mg/dL (ref 125–200)
HDL: 45 mg/dL — ABNORMAL LOW (ref >=46)
LDL CALC: 55 mg/dL (ref <130)
TRIGLYCERIDES: 194 mg/dL — AB (ref <150)
Total CHOL/HDL Ratio: 3.1 Ratio (ref <=5.0)
VLDL: 39 mg/dL — AB (ref <30)

## 2015-08-14 LAB — PAP, TP IMAGING W/ HPV RNA, RFLX HPV TYPE 16,18/45: HPV MRNA, HIGH RISK: NOT DETECTED

## 2015-08-18 ENCOUNTER — Other Ambulatory Visit: Payer: Self-pay | Admitting: Gynecology

## 2015-08-28 ENCOUNTER — Other Ambulatory Visit: Payer: Self-pay | Admitting: Gynecology

## 2015-08-28 ENCOUNTER — Telehealth: Payer: Self-pay | Admitting: *Deleted

## 2015-08-28 DIAGNOSIS — R5381 Other malaise: Secondary | ICD-10-CM

## 2015-08-28 NOTE — Telephone Encounter (Signed)
Rosemarie Ax will relay.

## 2015-08-28 NOTE — Telephone Encounter (Signed)
Pt called tried to schedule mammogram screening was asked if she is having any breast problems pt said she is having nipple pain, they told her she will need a diag. Mammogram due to pain. Pt said at annual on 08/13/15 she mention to you this nipple pain (no documentation in chart regarding this). Can pt have order for diagnosis mammogram? Please advise

## 2015-08-28 NOTE — Telephone Encounter (Signed)
OV

## 2015-08-31 ENCOUNTER — Encounter: Payer: Self-pay | Admitting: Gynecology

## 2015-08-31 ENCOUNTER — Ambulatory Visit (INDEPENDENT_AMBULATORY_CARE_PROVIDER_SITE_OTHER): Payer: Self-pay | Admitting: Gynecology

## 2015-08-31 VITALS — BP 138/86

## 2015-08-31 DIAGNOSIS — N644 Mastodynia: Secondary | ICD-10-CM

## 2015-08-31 NOTE — Progress Notes (Signed)
   Patient is a 45 year old was seen the office for annual exam in 08/13/2015 and was scheduled to have her mammogram but shortly before her mammogram she started experiencing bilateral breast tenderness which started before her menses and when she went to the radiology service they told that she needed to be examined she was having breast pain. She was totally asymptomatic today she stated started before her menses and resolves shortly thereafter. She describes not having felt any abnormalities on her breast and denies any recent trauma or any nipple discharge. Her husband has had a vasectomy. We reviewed her recent labs she is being treated with Lipitor for hyperlipidemia. Her recent labs at indicated that her triglycerides were elevated at 194 last year was normal at 144. She's had some issues with her diet and exercise. She currently weighs 202 pounds with a BMI of 36.94 kg/m.  Physical exam: Blood pressure 138/86 Well-developed 1 nurse female in no acute distress Both breasts are examined in the sitting and supine position. Left breast is symmetrical in appearance no skin discoloration or nipple inversion no palpable mass or tenderness and no supraclavicular axillary lymphadenopathy  Assessment/plan: 1 history of hyperlipidemia on Lipitor 10 mg daily. We stressed once again the importance of regular exercise for 1 hour 3 times a week adhering to a low fat diet. I've asked her to return back in 6 months for follow-up lipid profile. #2 asked her did resolve. We discussed importance of cutting down or caffeine-containing products and to continue to do her monthly breast exam. #3 requisition to schedule for her screening mammogram provided.  Greater than 90% of the time was spent counseling coordinate care for this patient. Total time of consultation 15 minutes

## 2015-08-31 NOTE — Patient Instructions (Signed)
Sensibilidad en las mamas (Breast Tenderness) La sensibilidad en las mamas es un problema frecuente en las mujeres de todas las edades. y puede causar molestias leves o dolor intenso. Sus causas son variadas. Su mdico determinar la causa probable de la sensibilidad mediante el examen de las mamas, las preguntas sobre los sntomas y la indicacin de algunos estudios. Por lo general, la sensibilidad en las mamas no significa que tenga cncer de mama. INSTRUCCIONES PARA EL CUIDADO EN EL HOGAR  A menudo, la sensibilidad en las mamas puede tratarse en el hogar. Puede intentar lo siguiente:  Probarse un nuevo sostn que le brinde ms sujecin, especialmente mientras hace actividad fsica.  Usar un sostn con mejor sujecin o uno deportivo mientras duerme cuando las mamas estn muy sensibles.  Si tiene una lesin mamaria, aplique hielo en la zona:  Ponga el hielo en una bolsa plstica.  Colquese una toalla entre la piel y la bolsa de hielo.  Deje el hielo durante 20 minutos y aplquelo 2 a 3 veces por da.  Si tiene las mamas repletas de leche debido a la lactancia, intente lo siguiente:  Extrigase leche manualmente o con un sacaleche.  Aplquese una compresa tibia en las mamas para ayudar a la descarga.  Tome analgsicos de venta libre si su mdico lo autoriza.  Tome otros medicamentos que su mdico le recete, entre ellos, antibiticos o anticonceptivos. A largo plazo, puede aliviar la sensibilidad en las mamas si hace lo siguiente:  Disminuye el consumo de cafena.  Disminuye la cantidad de grasa de la dieta. Lleva un registro de los das y las horas cuando tiene mayor sensibilidad en las mamas. Esto ser de ayuda para que usted y su mdico encuentren la causa de la sensibilidad y cmo aliviarla. Adems, aprenda cmo examinarse las mamas en casa. Esto la ayudar a palpar un crecimiento o un bulto fuera de lo normal que podra causar la sensibilidad. SOLICITE ATENCIN MDICA SI:    Cualquier zona de la mama est dura, enrojecida y caliente al tacto. Puede ser un signo de infeccin.  Hay secrecin de los pezones (y no est amamantando). En especial, vigile la secrecin de sangre o pus.  Tiene fiebre, adems de sensibilidad en las mamas.  Tiene un bulto nuevo o doloroso en la mama que no desaparece despus de la finalizacin del perodo menstrual.  Ha intentando controlar el dolor en casa, pero no desaparece.  El dolor de la mama es ms intenso o le dificulta hacer las cosas que hace habitualmente durante el da.   Esta informacin no tiene como fin reemplazar el consejo del mdico. Asegrese de hacerle al mdico cualquier pregunta que tenga.   Document Released: 10/31/2012 Elsevier Interactive Patient Education 2016 Elsevier Inc.  

## 2015-09-02 ENCOUNTER — Ambulatory Visit
Admission: RE | Admit: 2015-09-02 | Discharge: 2015-09-02 | Disposition: A | Discharge status: Home / Self Care | Payer: No Typology Code available for payment source | Source: Ambulatory Visit | Attending: Gynecology | Admitting: Gynecology

## 2015-09-02 DIAGNOSIS — R5381 Other malaise: Secondary | ICD-10-CM

## 2015-09-29 ENCOUNTER — Other Ambulatory Visit: Payer: Self-pay

## 2015-10-20 ENCOUNTER — Other Ambulatory Visit: Payer: Self-pay | Admitting: Gynecology

## 2015-10-20 ENCOUNTER — Encounter: Payer: Self-pay | Admitting: Gynecology

## 2015-10-20 ENCOUNTER — Ambulatory Visit (INDEPENDENT_AMBULATORY_CARE_PROVIDER_SITE_OTHER): Payer: Self-pay | Admitting: Gynecology

## 2015-10-20 ENCOUNTER — Other Ambulatory Visit: Payer: Self-pay | Admitting: Anesthesiology

## 2015-10-20 VITALS — BP 124/86

## 2015-10-20 DIAGNOSIS — Z23 Encounter for immunization: Secondary | ICD-10-CM

## 2015-10-20 DIAGNOSIS — R300 Dysuria: Secondary | ICD-10-CM

## 2015-10-20 DIAGNOSIS — N3001 Acute cystitis with hematuria: Secondary | ICD-10-CM

## 2015-10-20 LAB — URINALYSIS W MICROSCOPIC + REFLEX CULTURE
BILIRUBIN URINE: NEGATIVE
Casts: NONE SEEN [LPF]
Crystals: NONE SEEN [HPF]
GLUCOSE, UA: NEGATIVE
KETONES UR: NEGATIVE
Nitrite: NEGATIVE
PH: 7 (ref 5.0–8.0)
Protein, ur: NEGATIVE
Specific Gravity, Urine: <1.005 (ref 1.001–1.035)
YEAST: NONE SEEN [HPF]

## 2015-10-20 MED ORDER — FLUCONAZOLE 150 MG PO TABS: 150.0000 mg | ORAL_TABLET | Freq: Once | ORAL | 1 refills | Status: AC

## 2015-10-20 MED ORDER — PHENAZOPYRIDINE HCL 200 MG PO TABS: 200.0000 mg | ORAL_TABLET | Freq: Three times a day (TID) | ORAL | 0 refills | Status: AC

## 2015-10-20 MED ORDER — NITROFURANTOIN MONOHYD MACRO 100 MG PO CAPS: 100.0000 mg | ORAL_CAPSULE | Freq: Two times a day (BID) | ORAL | 0 refills | Status: AC

## 2015-10-20 NOTE — Patient Instructions (Signed)
Influenza Virus Vaccine (Flucelvax) Qu es este medicamento? La VACUNA ANTIGRIPAL ayuda a disminuir el riesgo de contraer la influenza, tambin conocida como la gripe. La vacuna solo ayuda a protegerle contra algunas cepas de influenza. Este medicamento puede ser utilizado para otros usos; si tiene alguna pregunta consulte con su proveedor de atencin mdica o con su farmacutico. Qu le debo informar a mi profesional de la salud antes de tomar este medicamento? Necesita saber si usted presenta alguno de los siguientes problemas o situaciones: -trastorno de sangrado como hemofilia -fiebre o infeccin -sndrome de Guillain-Barre u otros problemas neurolgicos -problemas del sistema inmunolgico -infeccin por el virus de la inmunodeficiencia humana (VIH) o SIDA -niveles bajos de plaquetas en la sangre -esclerosis mltiple -una reaccin alrgica o inusual a las vacunas antigripales, a otros medicamentos, alimentos, colorantes o conservantes -si est embarazada o buscando quedar embarazada -si est amamantando a un beb Cmo debo utilizar este medicamento? Esta vacuna se administra mediante inyeccin por va intramuscular. Lo administra un profesional de [employer]	[place]. Recibir una copia de informacin escrita sobre la vacuna antes de cada vacuna. Asegrese de leer este folleto cada vez cuidadosamente. Este folleto puede cambiar con frecuencia. Hable con su pediatra para informarse acerca del uso de este medicamento en nios. Puede requerir atencin especial. Sobredosis: Pngase en contacto inmediatamente con un centro toxicolgico o una sala de urgencia si usted cree que haya tomado demasiado medicamento. ATENCIN: [employer] es solo para usted. No comparta este medicamento con nadie. Qu sucede si me olvido de una dosis? No se aplica en este caso. Qu puede interactuar con este medicamento? -quimioterapia o radioterapia -medicamentos que suprimen el sistema inmunolgico, tales como  etanercept, anakinra, infliximab y adalimumab -medicamentos que tratan o previenen cogulos sanguneos, como warfarina -fenitona -medicamentos esteroideos, como la prednisona o la cortisona -teofilina -vacunas Puede ser que esta lista no menciona todas las posibles interacciones. Informe a su profesional de KB Home	[place] de [employer] productos a base de hierbas, medicamentos de [place] o suplementos nutritivos que est tomando. Si usted fuma, consume bebidas alcohlicas o si utiliza drogas ilegales, indqueselo tambin a su profesional de KB Home	[place]. Algunas sustancias pueden interactuar con su medicamento. A qu debo estar atento al usar Coca[employer]Cola? Informe a su mdico o a Barrister's clerk de la [employer] todos los efectos secundarios que persistan despus de 3 das. Llame a su proveedor de atencin mdica si se presentan sntomas inusuales dentro de las 6 semanas de recibir esta vacuna. Es posible que todava pueda contraer la gripe, pero la enfermedad no ser tan fuerte como normalmente. No puede contraer la gripe de esta vacuna. La vacuna antigripal no le protege contra resfros u otras enfermedades que pueden causar [place]. Debe vacunarse cada ao. Qu efectos secundarios puedo tener al [employer] este medicamento? Efectos secundarios que debe informar a su mdico o a Barrister's clerk de la salud tan pronto como sea posible: -reacciones alrgicas como erupcin cutnea, picazn o urticarias, hinchazn de la cara, labios o lengua Efectos secundarios que, por lo general, no requieren atencin mdica (debe informarlos a su mdico o a su profesional de la salud si persisten o si son molestos): -fiebre -dolor de cabeza -molestias y dolores musculares -dolor, sensibilidad, enrojecimiento o Estate agent de la inyeccin -cansancio Puede ser que esta lista no menciona todos los posibles efectos secundarios. Comunquese a su mdico por asesoramiento mdico [employer]. Usted  puede informar los efectos secundarios a la FDA por telfono al 1-800-FDA-1088. Dnde  debo guardar mi medicina? Esta vacuna se administrar por un profesional de la salud en una [place], Engineer, mining, consultorio mdico u otro consultorio de un profesional de la salud. No se le suministrar esta vacuna para guardar en su domicilio. ATENCIN: Este folleto es un resumen. Puede ser que no cubra toda la posible informacin. Si usted tiene preguntas acerca de esta medicina, consulte con su mdico, su farmacutico o su profesional de Technical sales engineer.    2016, Elsevier/Gold Standard. (2010-12-27 16:29:16) Phenazopyridine tablets Qu es este medicamento? La FENAZOPIRIDINA es un analgsico. Genene Churn para Best boy, ardor o molestia causada por una infeccin o irritacin del tracto urinario. Este medicamento no es un antibitico. No curar una infeccin del tracto urinario. Este medicamento puede ser utilizado para otros usos; si tiene alguna pregunta consulte con su proveedor de atencin mdica o con su farmacutico. Qu le debo informar a mi profesional de la salud antes de tomar este medicamento? Necesita saber si usted presenta alguno de los siguientes problemas o situaciones: -deficiencia de glucosa-6-fosfato deshidrogenasa (L-6PD) -enfermedad renal -una reaccin alrgica o inusual a la fenazopiridina, a otros medicamentos, alimentos, colorantes o conservantes -si est embarazada o buscando quedar embarazada -si est amamantando a un beb Cmo debo utilizar este medicamento? Tome este medicamento por va oral con un vaso de agua. Siga las instrucciones de la etiqueta del [place]. Tmelo despus de las comidas. Tome sus dosis a intervalos regulares. No tome su medicamento con una frecuencia mayor que la indicada. No omita ninguna dosis o suspenda el uso de su medicamento antes de lo indicado aun si se siente mejor. No deje de tomarlo excepto si as lo indica su mdico. Hable con su pediatra para  informarse acerca del uso de este medicamento en nios. Puede requerir atencin especial. Sobredosis: Pngase en contacto inmediatamente con un centro toxicolgico o una sala de urgencia si usted cree que haya tomado demasiado medicamento. ATENCIN: [employer] es solo para usted. No comparta este medicamento con nadie. Qu sucede si me olvido de una dosis? Si olvida una dosis, tmela lo antes posible. Si es casi la hora de la prxima dosis, tome slo esa dosis. No tome dosis adicionales o dobles. Qu puede interactuar con este medicamento? No se esperan interacciones. Puede ser que esta lista no menciona todas las posibles interacciones. Informe a su profesional de KB Home	[place] de [employer] productos a base de hierbas, medicamentos de [place] o suplementos nutritivos que est tomando. Si usted fuma, consume bebidas alcohlicas o si utiliza drogas ilegales, indqueselo tambin a su profesional de KB Home	[place]. Algunas sustancias pueden interactuar con su medicamento. A qu debo estar atento al usar Coca-Cola? Si los sntomas no mejoran o si empeoran, consulte con su mdico o con su profesional de KB Home	[place]. Este medicamento produce un color rojo en los lquidos corporales. Este efecto es inofensivo y Armed forces operational officer despus de que deje de tomar este medicamento. Este medicamento cambiar el color de su orina a un color naranja oscura o rojo. El color rojo puede manchar la ropa. Los lentes de contacto se pueden Marketing executive. Es preferible no usar lentes de contacto blandos mientras est tomando este medicamento Si es diabtico podr Therapist, music un resultado positivo falso en los anlisis de determinacin del nivel de Location manager en la orina. Consulte a su proveedor de Geophysical data processor. Qu efectos secundarios puedo tener al [employer] este medicamento? Efectos secundarios que debe informar a su mdico o a Barrister's clerk de la salud tan pronto como sea posible: -  reacciones alrgicas como erupcin  cutnea, picazn o urticarias, hinchazn de la cara, labios o lengua -color azul o morado de la piel -dificultad al respirar -fiebre -orinar menos -sangrado, magulladuras inusuales -cansancio o debilidad inusual -vmitos -color amarillento de los ojos o la piel Efectos secundarios que, por lo general, no requieren Geophysical data processor (debe informarlos a su mdico o a su profesional de la salud si persisten o si son molestos): -orina de color amarillo oscuro -dolor de cabeza -Higher education careers adviser Puede ser que esta lista no menciona todos los posibles efectos secundarios. Comunquese a su mdico por asesoramiento mdico [employer]. Usted puede informar los efectos secundarios a la FDA por telfono al 1-800-FDA-1088. Dnde debo guardar mi medicina? Mantngala fuera del alcance de los nios. Gurdela a [employer], entre 15 y 49 grados C (35 y 103 grados F). Protjala de la [place] y de la humedad. Deseche todo el medicamento que no haya utilizado, despus de la fecha de vencimiento. ATENCIN: Este folleto es un resumen. Puede ser que no cubra toda la posible informacin. Si usted tiene preguntas acerca de esta medicina, consulte con su mdico, su farmacutico o su profesional de Technical sales engineer.    2016, Elsevier/Gold Standard. (2014-03-04 00:00:00) Nitrofurantoin tablets or capsules Qu es este medicamento? La NITROFURANTONA es un antibitico. Se utiliza en el tratamiento de la infecciones del tracto urinario. Este medicamento puede ser utilizado para otros usos; si tiene alguna pregunta consulte con su proveedor de atencin mdica o con su farmacutico. Qu le debo informar a mi profesional de la salud antes de tomar este medicamento? Necesita saber si usted presenta alguno de los [employer] o situaciones: -anemia -diabetes -deficiencia de glucosa-6-fosfato deshidrogenasa -enfermedad renal -enfermedad heptica -enfermedad pulmonar -otras enfermedades  crnicas -una reaccin alrgica o inusual a la nitrofurantona, a otros antibiticos, a otros medicamentos, alimentos, colorantes o conservantes -si est embarazada o buscando quedar embarazada -si est amamantando a un beb Cmo debo utilizar este medicamento? Tome este medicamento por va oral con un vaso de agua. Siga las instrucciones de la etiqueta del [place]. Tome este medicamento con leche o con alimentos. Tome sus dosis a intervalos regulares. No tome su medicamento con una frecuencia mayor que la indicada. No deje de tomarlo excepto si as lo indica su mdico. Hable con su pediatra para informarse acerca del uso de este medicamento en nios. Aunque este medicamento se puede recetar para condiciones selectivas, las precauciones se aplican. Sobredosis: Pngase en contacto inmediatamente con un centro toxicolgico o una sala de urgencia si usted cree que haya tomado demasiado medicamento. ATENCIN: [employer] es solo para usted. No comparta este medicamento con nadie. Qu sucede si me olvido de una dosis? Si olvida una dosis, tmela lo antes posible. Si es casi la hora de la prxima dosis, tome slo esa dosis. No tome dosis adicionales o dobles. Qu puede interactuar con este medicamento? -anticidos que contienen trisilicato de magnesio -probenecid -antibiticos quinolnicos, tales como ciprofloxacina, lomefloxacino, norfloxacino y ofloxacino -sulfapirazona Puede ser que esta lista no menciona todas las posibles interacciones. Informe a su profesional de KB Home	[place] de [employer] productos a base de hierbas, medicamentos de [place] o suplementos nutritivos que est tomando. Si usted fuma, consume bebidas alcohlicas o si utiliza drogas ilegales, indqueselo tambin a su profesional de KB Home	[place]. Algunas sustancias pueden interactuar con su medicamento. A qu debo estar atento al usar Coca-Cola? Si los sntomas no mejoran o si experimenta nuevos sntomas, consulte con su  mdico o su  profesional de KB Home	[place]. Beba varios vasos de Public affairs consultant. Si toma este medicamento durante un perodo de [employer], debe visitar a su mdico para chequear su evolucin peridicamente. Si es diabtico, podr Therapist, music un resultado positivo falso en los [employer] de determinacin del nivel de azcar en la orina con algunas marcas de [place] de [place]. Consulte con su mdico. Qu efectos secundarios puedo tener al [employer] este medicamento? Efectos secundarios que debe informar a su mdico o a Barrister's clerk de la salud tan pronto como sea posible: -Chief of Staff como erupcin cutnea o urticarias, hinchazn de la cara, labios o lengua -dolor en el pecho -tos -dificultad al respirar -mareos, somnolencia -fiebre o infeccin -molestias o dolores articulares -piel plida o teida azul -enrojecimiento, formacin de ampollas, descamacin o aflojamiento de la piel, inclusive dentro de la boca -hormigueo, ardor, Social research officer, government o entumecimiento de las manos o los pies -sangrado o magulladuras inusuales -cansancio o debilidad inusual -color amarillento de ojos o piel Efectos secundarios que, por lo general, no requieren Geophysical data processor (debe informarlos a su mdico o a su profesional de la salud si persisten o si son molestos): -orina de color amarillo oscuro -diarrea -dolor de cabeza -prdida del apetito -nuseas o vmitos -prdida del cabello temporal Puede ser que esta lista no menciona todos los posibles efectos secundarios. Comunquese a su mdico por asesoramiento mdico [employer]. Usted puede informar los efectos secundarios a la FDA por telfono al 1-800-FDA-1088. Dnde debo guardar mi medicina? Mantngala fuera del alcance de los nios. Gurdela a [employer], entre 15 y 37 grados C (3 y 52 grados F). Protjala de la [place]. Deseche todo el medicamento que no haya utilizado, despus de la fecha de vencimiento. ATENCIN: Este folleto es un resumen.  Puede ser que no cubra toda la posible informacin. Si usted tiene preguntas acerca de esta medicina, consulte con su mdico, su farmacutico o su profesional de Technical sales engineer.    2016, Elsevier/Gold Standard. (2014-03-04 00:00:00) Infeccin urinaria  (Urinary Tract Infection)  La infeccin urinaria puede ocurrir en cualquier lugar del tracto urinario. El tracto urinario es un sistema de drenaje del cuerpo por el que se eliminan los desechos y el exceso de [place]. El tracto urinario est formado por dos riones, dos urteres, la vejiga y Geologist, engineering. Los riones son rganos que tienen forma de frijol. Cada rin tiene aproximadamente el tamao del puo. Estn situados debajo de las [place], uno a cada lado de la columna vertebral CAUSAS  La causa de la infeccin son los microbios, que son organismos microscpicos, que incluyen hongos, virus, y bacterias. Estos organismos son tan pequeos que slo pueden verse a travs del microscopio. Las bacterias son los microorganismos que ms comnmente causan infecciones urinarias.  SNTOMAS  Los sntomas pueden variar segn la edad y el sexo del paciente y por la ubicacin de la infeccin. Los sntomas en las mujeres jvenes incluyen la necesidad frecuente e intensa de orinar y una sensacin dolorosa de ardor en la vejiga o en la uretra durante la miccin. Las mujeres y los hombres mayores podrn sentir cansancio, temblores y debilidad y Arts development officer musculares y Social research officer, government abdominal. Si tiene [place], puede significar que la infeccin est en los riones. Otros sntomas son dolor en la espalda o en los lados debajo de las [place], nuseas y vmitos.  DIAGNSTICO  Para diagnosticar una infeccin urinaria, el mdico le preguntar acerca de sus sntomas. [employer] una [employer] de [place]. La [place] de [place] se Manufacturing engineer  para detectar bacterias y glbulos blancos de la sangre. Los glbulos blancos se forman en el organismo para ayudar a Radio broadcast assistant las infecciones.   TRATAMIENTO  Por lo general, las infecciones urinarias pueden tratarse con medicamentos. Debido a que la [employer] de las infecciones son causadas por bacterias, por lo general pueden tratarse con antibiticos. La eleccin del antibitico y la duracin del tratamiento depender de sus sntomas y el tipo de bacteria causante de la infeccin.  INSTRUCCIONES PARA EL CUIDADO EN EL HOGAR   Si le recetaron antibiticos, tmelos exactamente como su mdico le indique. Termine el medicamento aunque se sienta mejor despus de haber tomado slo algunos.  Beba gran cantidad de lquido para mantener la orina de tono claro o color amarillo plido.  Evite la cafena, el t y las bebidas gaseosas. Estas sustancias irritan la vejiga.  Vaciar la vejiga con frecuencia. Evite retener la orina durante largos perodos.  Vace la vejiga antes y despus de Clinical biochemist.  Despus de mover el intestino, las mujeres deben higienizarse la regin perineal desde adelante hacia atrs. Use slo un papel tissue por vez. SOLICITE ATENCIN MDICA SI:   Siente dolor en la espalda.  Le sube la fiebre.  Los sntomas no mejoran luego de 3 das. SOLICITE ATENCIN MDICA DE INMEDIATO SI:   Siente dolor intenso en la espalda o en la zona inferior del abdomen.  Comienza a sentir escalofros.  Tiene nuseas o vmitos.  Tiene una sensacin continua de quemazn o molestias al [employer]. ASEGRESE DE QUE:   Comprende estas instrucciones.  Controlar su enfermedad.  Solicitar ayuda de inmediato si no mejora o empeora.   Esta informacin no tiene Marine scientist el consejo del mdico. Asegrese de hacerle al mdico cualquier pregunta que tenga.   Document Released: 10/20/2004 Document Revised: 10/05/2011 Elsevier Interactive Patient Education [employer].

## 2015-10-20 NOTE — Progress Notes (Signed)
   HPI: Patient is a 45 year old presented to the office today complaining one-week history of urinary frequency and dysuria that she's finishing voiding. She denied any fever, chills, nausea, or any vomiting. She denied any back pain or any vaginal discharge. Her last menstrual period was approximately 3 and half weeks ago. Her husband has had a vasectomy. Patient requesting flu vaccine.   ROS: A ROS was performed and pertinent positives and negatives are included in the history.  GENERAL: No fevers or chills. HEENT: No change in vision, no earache, sore throat or sinus congestion. NECK: No pain or stiffness. CARDIOVASCULAR: No chest pain or pressure. No palpitations. PULMONARY: No shortness of breath, cough or wheeze. GASTROINTESTINAL: No abdominal pain, nausea, vomiting or diarrhea, melena or bright red blood per rectum. GENITOURINARY: No urinary frequency, urgency, hesitancy or dysuria. MUSCULOSKELETAL: No joint or muscle pain, no back pain, no recent trauma. DERMATOLOGIC: No rash, no itching, no lesions. ENDOCRINE: No polyuria, polydipsia, no heat or cold intolerance. No recent change in weight. HEMATOLOGICAL: No anemia or easy bruising or bleeding. NEUROLOGIC: No headache, seizures, numbness, tingling or weakness. PSYCHIATRIC: No depression, no loss of interest in normal activity or change in sleep pattern.   PE: Blood pressure 124/86 Gen. appearance well-developed well-nourished female with the above-mentioned complaint  Back no CVA tenderness Abdomen: Mild suprapubic tenderness without any rebound or guarding Pelvic exam not done Rectal exam not done  Urinalysis: Moderate bacteria, 40-60 WBC, 20-40 RBC    Assessment Plan: Patient with clinical evidence of urinary tract infection will be treated with Macrobid one by mouth twice a day for 7 days. For bladder spasms she'll be prescribed Pyridium 200 mg 3 times a day for 3 days. Because of patient's sensitivity to antibiotics and developing  yeast infection a prescription for Diflucan 150 mg by mouth will be provided as well. Patient received the flu vaccine after she was counseled and literature information was provided all the above in [place].    Greater than 50% of time was spent in counseling and coordinating care of this patient.   Time of consultation: 15   Minutes.

## 2015-10-20 NOTE — Addendum Note (Signed)
Addended by: Thurnell Garbe A on: 10/20/2015 12:41 PM   Modules accepted: Orders

## 2015-10-23 ENCOUNTER — Other Ambulatory Visit: Payer: Self-pay | Admitting: *Deleted

## 2015-10-23 LAB — URINE CULTURE

## 2015-10-23 MED ORDER — CIPROFLOXACIN HCL 250 MG PO TABS: 250.0000 mg | ORAL_TABLET | Freq: Two times a day (BID) | ORAL | 0 refills | Status: DC

## 2016-02-12 ENCOUNTER — Other Ambulatory Visit: Payer: Self-pay | Admitting: *Deleted

## 2016-02-12 DIAGNOSIS — E7889 Other lipoprotein metabolism disorders: Secondary | ICD-10-CM

## 2016-03-18 ENCOUNTER — Ambulatory Visit (INDEPENDENT_AMBULATORY_CARE_PROVIDER_SITE_OTHER): Payer: Self-pay | Admitting: Gynecology

## 2016-03-18 ENCOUNTER — Encounter: Payer: Self-pay | Admitting: Gynecology

## 2016-03-18 VITALS — BP 128/80 | Ht 62.0 in | Wt 182.0 lb

## 2016-03-18 DIAGNOSIS — R102 Pelvic and perineal pain: Secondary | ICD-10-CM

## 2016-03-18 DIAGNOSIS — N898 Other specified noninflammatory disorders of vagina: Secondary | ICD-10-CM

## 2016-03-18 DIAGNOSIS — R42 Dizziness and giddiness: Secondary | ICD-10-CM

## 2016-03-18 DIAGNOSIS — N3000 Acute cystitis without hematuria: Secondary | ICD-10-CM

## 2016-03-18 LAB — URINALYSIS W MICROSCOPIC + REFLEX CULTURE
BILIRUBIN URINE: NEGATIVE
CASTS: NONE SEEN [LPF]
CRYSTALS: NONE SEEN [HPF]
Glucose, UA: NEGATIVE
KETONES UR: NEGATIVE
NITRITE: NEGATIVE
Protein, ur: NEGATIVE
SPECIFIC GRAVITY, URINE: 1.015 (ref 1.001–1.035)
Yeast: NONE SEEN [HPF]
pH: 5.5 (ref 5.0–8.0)

## 2016-03-18 LAB — WET PREP FOR TRICH, YEAST, CLUE
CLUE CELLS WET PREP: NONE SEEN
TRICH WET PREP: NONE SEEN

## 2016-03-18 LAB — HEMOGLOBIN A1C
HEMOGLOBIN A1C: 5.7 % — AB (ref <5.7)
MEAN PLASMA GLUCOSE: 117 mg/dL

## 2016-03-18 MED ORDER — TINIDAZOLE 500 MG PO TABS: ORAL_TABLET | ORAL | 0 refills | Status: DC

## 2016-03-18 MED ORDER — FLUCONAZOLE 150 MG PO TABS: 150.0000 mg | ORAL_TABLET | Freq: Once | ORAL | 0 refills | Status: AC

## 2016-03-18 NOTE — Patient Instructions (Signed)
Vaginosis bacteriana (Bacterial Vaginosis) La vaginosis bacteriana es una infeccin de la vagina. Se produce cuando crece una cantidad excesiva de grmenes normales (bacterias sanas) en la vagina. Esta infeccin aumenta el riesgo de contraer otras infecciones de transmisin sexual. El tratamiento de esta infeccin puede ayudar a reducir el riesgo de otras infecciones, como:  Clamidia.  Roderick Pee.  VIH.  Herpes. [place] los medicamentos tal como se lo indic su mdico.  Finalice la prescripcin completa, aunque comience a sentirse mejor.  Comunique a sus compaeros sexuales que sufre una infeccin. Deben consultar a su mdico para iniciar un tratamiento.  Durante el tratamiento:  Teacher, music o use preservativos de Cabin crew.  No se haga duchas vaginales.  No consuma alcohol a menos que el mdico lo autorice.  No amamante a menos que el mdico la autorice. SOLICITE AYUDA SI:  No mejora luego de 3 das de tratamiento.  Observa una secrecin (prdida) de color gris ms abundante que proviene de la vagina.  Siente ms dolor que antes.  Tiene fiebre. ASEGRESE DE QUE:  Comprende estas instrucciones.  Controlar su afeccin.  Recibir ayuda de inmediato si no mejora o si empeora. Esta informacin no tiene Marine scientist el consejo del mdico. Asegrese de hacerle al mdico cualquier pregunta que tenga. Document Released: 04/08/2008 Document Revised: 05/04/2015 Document Reviewed: 08/22/2012 Elsevier Interactive Patient Education  2017 Elsevier Inc. Candidiasis vaginal en los adultos (Gastrointestinal Yeast Infection, Adult) La candidiasis vaginal es una afeccin que causa dolor, hinchazn y enrojecimiento (inflamacin) de la vagina. Tambin causa secrecin vaginal. Esta es una enfermedad frecuente. Algunas mujeres contraen esta infeccin con frecuencia. CAUSAS La causa de la infeccin es un cambio en el equilibrio  normal de los hongos (cndida) y las bacterias que viven en la vagina. Esta alteracin deriva en el crecimiento excesivo de los hongos, lo que causa la inflamacin. FACTORES DE RIESGO Es ms probable que esta afeccin se manifieste en:  Las mujeres que toman antibiticos.  Las mujeres que tienen diabetes.  Las mujeres que toman anticonceptivos.  Las mujeres que estn embarazadas.  Las mujeres que se hacen duchas vaginales con frecuencia.  Las mujeres que tienen un sistema de defensa (inmunitario) dbil.  Las mujeres que han tomado corticoides durante mucho tiempo.  Las mujeres que usan ropa ajustada con frecuencia. SNTOMAS Los sntomas de esta afeccin incluyen lo siguiente:  Secrecin vaginal blanca y espesa.  Hinchazn, picazn, enrojecimiento e irritacin de la vagina. Los labios de la vagina (vulva) tambin se pueden infectar.  Dolor o ardor al Garment/textile technologist.  [place]. DIAGNSTICO Esta afeccin se diagnostica mediante la historia clnica y un examen fsico. Este incluye un examen plvico. El mdico examinar una muestra de la secrecin vaginal con un microscopio. Probablemente el mdico enve esta muestra al laboratorio para analizarla y confirmar el diagnstico. TRATAMIENTO Esta afeccin se trata con medicamentos. Los [employer] pueden ser recetados o de venta libre. Podrn indicarle que use uno o ms de lo siguiente:  Medicamentos por va oral.  Medicamentos que se aplican como una crema.  Medicamentos que se colocan directamente en la vagina (vulos vaginales). [place] o aplquese los medicamentos de venta libre y Editor, commissioning como se lo haya indicado el mdico.  No tenga relaciones sexuales hasta que el mdico lo autorice. Comunique a su compaero sexual que tiene una infeccin por hongos. Esas personas deben consultar al mdico si tienen sntomas.  No use ropa ajustada, como pantis o  pantalones ajustados.  Evite el uso de tampones hasta que el mdico lo autorice.  Consuma ms yogur. Esto puede ayudar a Technical brewer de la candidiasis.  Intente darse un bao de asiento para [employer]. Se trata de un bao de agua tibia que se toma mientras se est sentado. El agua solo debe Systems analyst las caderas y cubrir las nalgas. Hgalo 3o 4veces al da o como se lo haya indicado el mdico.  No se haga duchas vaginales.  Use ropa interior transpirable de algodn.  Si tiene diabetes, mantenga bajo control los niveles de Dispensing optician. SOLICITE ATENCIN MDICA SI:  Jaclynn Guarneri.  Los sntomas desaparecen y Teacher, adult education.  Los sntomas no mejoran con Dispensing optician.  Los sntomas empeoran.  Aparecen nuevos sntomas.  Aparecen ampollas alrededor o adentro de la vagina.  Le sale sangre de la vagina y no est menstruando.  Siente dolor en el abdomen. Esta informacin no tiene Marine scientist el consejo del mdico. Asegrese de hacerle al mdico cualquier pregunta que tenga. Document Released: 10/20/2004 Document Revised: 05/04/2015 Document Reviewed: 07/14/2014 Elsevier Interactive Patient Education  2017 [employer].

## 2016-03-18 NOTE — Addendum Note (Signed)
Addended by: Burnett Kanaris on: 03/18/2016 12:20 PM   Modules accepted: Orders

## 2016-03-18 NOTE — Progress Notes (Signed)
   Patient is a 46 year old that presented to the office today with a complaint for 3 days ago abdominal discomfort in the first day was very strong. Patient was uncertain if she had gross hematuria or vaginal bleeding. She also has had a vaginal discharge for 2 days. She denied any fever, chills, nausea, vomiting or any urinary frequency or dysuria. Patient did state that over week ago she felt some lightheadedness when she changes position but did not lose consciousness and has not had this problem before. She denies any family history of any diabetes or gestational diabetes herself. She is exercising and eating healthier she was weighing 202 pounds last year and she is down to 182. Her husband has had a vasectomy.  Exam: Back: No CVA tenderness Abdomen: Soft slightly tender suprapubic Pelvic: Bartholin urethra Skene was within normal limits Vagina: No gross lesions noted slight clear discharge Cervix: No gross lesions on inspection Uterus: Anteverted normal size shape and consistency slightly tender suprapubically Adnexa no palpable mass or tenderness Rectal exam not done  Urinalysis: 0-5 WBC, 0-2 RBC, few bacteria  Wet prep moderate yeast, many white blood cell, moderate bacteria  Assessment/plan: Patient with evidence of yeast vaginitis and bacterial vaginosis will be treated following fashion: Diflucan 150 mg today. Tindamax 500 mg tablet she is to take 4 tablets today repeat in 24 hours. Patient scheduled to return back in July for annual exam.

## 2016-03-20 LAB — URINE CULTURE: Organism ID, Bacteria: NO GROWTH

## 2016-06-08 ENCOUNTER — Encounter: Payer: Self-pay | Admitting: Gynecology

## 2016-08-11 ENCOUNTER — Other Ambulatory Visit: Payer: Self-pay | Admitting: Gynecology

## 2016-08-11 DIAGNOSIS — Z1231 Encounter for screening mammogram for malignant neoplasm of breast: Secondary | ICD-10-CM

## 2016-08-23 ENCOUNTER — Ambulatory Visit (INDEPENDENT_AMBULATORY_CARE_PROVIDER_SITE_OTHER): Payer: Self-pay | Admitting: Gynecology

## 2016-08-23 ENCOUNTER — Encounter: Payer: Self-pay | Admitting: Gynecology

## 2016-08-23 VITALS — BP 126/78 | Ht 64.25 in | Wt 187.8 lb

## 2016-08-23 DIAGNOSIS — Z01419 Encounter for gynecological examination (general) (routine) without abnormal findings: Secondary | ICD-10-CM

## 2016-08-23 DIAGNOSIS — E782 Mixed hyperlipidemia: Secondary | ICD-10-CM

## 2016-08-23 DIAGNOSIS — E559 Vitamin D deficiency, unspecified: Secondary | ICD-10-CM

## 2016-08-23 MED ORDER — ATORVASTATIN CALCIUM 10 MG PO TABS: 10.0000 mg | ORAL_TABLET | Freq: Every day | ORAL | 11 refills | Status: DC

## 2016-08-23 NOTE — Addendum Note (Signed)
Addended by: Dorothyann Gibbs on: 08/23/2016 04:07 PM   Modules accepted: Orders

## 2016-08-23 NOTE — Progress Notes (Signed)
Gabrielle [facility] Aug 08, 1970 [id number]   History:    46 y.o.  for annual gyn exam with no complaints today.Patient with history of hyperlipidemia has done well on Lipitor. She also has had history of depression in the past and no longer on Paxil.In August 2015 patient had a resectoscopic polypectomy and endometrial ablation and is having very light normal menstrual cycles. Her husband has had a vasectomy. 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 SEE COMMENT. - BENIGN ENDOCERVICAL MUCOSA. - SCANT FRAGMENTS OF BENIGN PROLIFERATIVE PHASE ENDOMETRIUM. 2. Cervix, biopsy, 4 o'clock - LOW GRADE SQUAMOUS INTRAEPITHELIAL LESION, CIN-I (MILD DYSPLASIA). SEE COMMENT. 3. Cervix, biopsy, 12 o'clock - LOW GRADE SQUAMOUS INTRAEPITHELIAL LESION, CIN-I (MILD DYSPLASIA). SEE COMMENT. Microscopic Comment 1. 2, and 3: The findings correlate with the previous Pap test results ([id number]).  Patient's Pap smear 2015 demonstrated atypical squamous cells of undetermined significance but negative HPV  Pap smear 2016 was normal and 2017. Patient has had history vitamin D deficiency in the past. She did report that in May and June she had 2 periods but in July showing had one cycle  Past medical history,surgical history, family history and social history were all reviewed and documented in the EPIC chart.  Gynecologic History Patient's last menstrual period was 07/29/2016  (approximate). Contraception: vasectomy Last Pap: 2016. Results were: normal Last mammogram: 2017. Results were: normal  Obstetric History OB History  Gravida Para Term Preterm AB Living  4 4 4     4   SAB TAB Ectopic Multiple Live Births          4    # Outcome Date GA Lbr Len/2nd Weight Sex Delivery Anes PTL Lv  4 Term     F Vag-Spont  N LIV  3 Term     F Vag-Spont  N LIV  2 Term     F Vag-Spont  N LIV  1 Term     F Vag-Spont  N LIV       ROS: A ROS was performed and pertinent positives and negatives are included in the history.  GENERAL: No fevers or chills. HEENT: No change in vision, no earache, sore throat or sinus congestion. NECK: No pain or stiffness. CARDIOVASCULAR: No chest pain or pressure. No palpitations. PULMONARY: No shortness of breath, cough or wheeze. GASTROINTESTINAL: No abdominal pain, nausea, vomiting or diarrhea, melena or bright red blood per rectum. GENITOURINARY: No urinary frequency, urgency, hesitancy or dysuria. MUSCULOSKELETAL: No joint or muscle pain, no back pain, no recent trauma. DERMATOLOGIC: No rash, no itching, no lesions. ENDOCRINE: No polyuria, polydipsia, no heat or cold intolerance. No recent change in weight. HEMATOLOGICAL: No anemia or easy bruising or bleeding. NEUROLOGIC: No headache, seizures, numbness, tingling or weakness. PSYCHIATRIC: No depression, no loss of interest in normal activity or change in sleep pattern.     Exam: chaperone present  BP 126/78   Ht 5' 4.25" (1.632 m)   Wt 187 lb 12.8 oz (85.2 kg)   LMP 07/29/2016 (Approximate)   BMI 31.99  kg/m   Body mass index is 31.99 kg/m.  General appearance : Well developed well nourished female. No acute distress HEENT: Eyes: no retinal hemorrhage or exudates,  Neck supple, trachea midline, no carotid bruits, no thyroidmegaly Lungs: Clear to auscultation, no rhonchi or wheezes, or rib retractions  Heart: Regular rate and rhythm, no murmurs or gallops Breast:Examined in sitting  and supine position were symmetrical in appearance, no palpable masses or tenderness,  no skin retraction, no nipple inversion, no nipple discharge, no skin discoloration, no axillary or supraclavicular lymphadenopathy Abdomen: no palpable masses or tenderness, no rebound or guarding Extremities: no edema or skin discoloration or tenderness  Pelvic:  Bartholin, Urethra, Skene Glands: Within normal limits             Vagina: No gross lesions or discharge  Cervix: No gross lesions or discharge  Uterus  anteverted, normal size, shape and consistency, non-tender and mobile  Adnexa  Without masses or tenderness  Anus and perineum  normal   Rectovaginal  normal sphincter tone without palpated masses or tenderness             Hemoccult not indicated     Assessment/Plan:  46 y.o. female for annual exam will return tomorrow and a fasting state for fasting blood work consisting of the following: Comprehensive metabolic panel, fasting lipid profile, TSH, CBC, and urinalysis. Because history vitamin D deficiency in the past we'll check her vitamin D level today. Pap smear not indicated this year. Patient was reminded to schedule her mammogram next month. Prescription refill for Lipitor 10 mg which she takes daily was provided.   [name] MD, 3:07 PM 08/23/2016

## 2016-08-23 NOTE — Addendum Note (Signed)
Addended by: Joaquin Music on: 08/23/2016 03:59 PM   Modules accepted: Orders

## 2016-08-24 ENCOUNTER — Other Ambulatory Visit: Payer: Self-pay

## 2016-08-24 LAB — CBC WITH DIFFERENTIAL/PLATELET
Basophils Absolute: 0 cells/uL (ref 0–200)
Basophils Relative: 0 %
Eosinophils Absolute: 72 cells/uL (ref 15–500)
Eosinophils Relative: 1 %
HCT: 36.6 % (ref 35.0–45.0)
Hemoglobin: 12.2 g/dL (ref 11.7–15.5)
Lymphocytes Relative: 38 %
Lymphs Abs: 2736 cells/uL (ref 850–3900)
MCH: 26.3 pg — ABNORMAL LOW (ref 27.0–33.0)
MCHC: 33.3 g/dL (ref 32.0–36.0)
MCV: 79 fL — ABNORMAL LOW (ref 80.0–100.0)
MPV: 10.5 fL (ref 7.5–12.5)
Monocytes Absolute: 576 cells/uL (ref 200–950)
Monocytes Relative: 8 %
Neutro Abs: 3816 cells/uL (ref 1500–7800)
Neutrophils Relative %: 53 %
Platelets: 274 10*3/uL (ref 140–400)
RBC: 4.63 MIL/uL (ref 3.80–5.10)
RDW: 17.2 % — ABNORMAL HIGH (ref 11.0–15.0)
WBC: 7.2 10*3/uL (ref 3.8–10.8)

## 2016-08-24 LAB — URINALYSIS W MICROSCOPIC + REFLEX CULTURE
BILIRUBIN URINE: NEGATIVE
Bacteria, UA: NONE SEEN [HPF]
CRYSTALS: NONE SEEN [HPF]
Casts: NONE SEEN [LPF]
Glucose, UA: NEGATIVE
Ketones, ur: NEGATIVE
NITRITE: NEGATIVE
PH: 6 (ref 5.0–8.0)
Protein, ur: NEGATIVE
SPECIFIC GRAVITY, URINE: 1.011 (ref 1.001–1.035)
Yeast: NONE SEEN [HPF]

## 2016-08-24 LAB — TSH: TSH: 3.52 mIU/L

## 2016-08-25 ENCOUNTER — Other Ambulatory Visit: Payer: Self-pay | Admitting: Anesthesiology

## 2016-08-25 ENCOUNTER — Encounter: Payer: Self-pay | Admitting: Gynecology

## 2016-08-25 DIAGNOSIS — E559 Vitamin D deficiency, unspecified: Secondary | ICD-10-CM

## 2016-08-25 LAB — URINE CULTURE

## 2016-08-25 LAB — LIPID PANEL
CHOLESTEROL: 184 mg/dL (ref <200)
HDL: 44 mg/dL — ABNORMAL LOW (ref >50)
LDL Cholesterol: 110 mg/dL — ABNORMAL HIGH (ref <100)
TRIGLYCERIDES: 150 mg/dL — AB (ref <150)
Total CHOL/HDL Ratio: 4.2 Ratio (ref <5.0)
VLDL: 30 mg/dL (ref <30)

## 2016-08-25 LAB — VITAMIN D 25 HYDROXY (VIT D DEFICIENCY, FRACTURES): VIT D 25 HYDROXY: 26 ng/mL — AB (ref 30–100)

## 2016-08-25 LAB — COMPREHENSIVE METABOLIC PANEL
ALBUMIN: 3.8 g/dL (ref 3.6–5.1)
ALK PHOS: 59 U/L (ref 33–115)
ALT: 28 U/L (ref 6–29)
AST: 25 U/L (ref 10–35)
BILIRUBIN TOTAL: 0.5 mg/dL (ref 0.2–1.2)
BUN: 8 mg/dL (ref 7–25)
CHLORIDE: 105 mmol/L (ref 98–110)
CO2: 17 mmol/L — ABNORMAL LOW (ref 20–31)
CREATININE: 0.61 mg/dL (ref 0.50–1.10)
Calcium: 8.6 mg/dL (ref 8.6–10.2)
Glucose, Bld: 98 mg/dL (ref 65–99)
Potassium: 4 mmol/L (ref 3.5–5.3)
SODIUM: 137 mmol/L (ref 135–146)
TOTAL PROTEIN: 6.1 g/dL (ref 6.1–8.1)

## 2016-08-25 MED ORDER — VITAMIN D (ERGOCALCIFEROL) 1.25 MG (50000 UNIT) PO CAPS: 50000.0000 [IU] | ORAL_CAPSULE | ORAL | 0 refills | Status: DC

## 2016-08-26 ENCOUNTER — Other Ambulatory Visit: Payer: Self-pay | Admitting: Anesthesiology

## 2016-08-26 MED ORDER — AMPICILLIN 500 MG PO CAPS: ORAL_CAPSULE | ORAL | 0 refills | Status: DC

## 2016-09-02 ENCOUNTER — Ambulatory Visit: Payer: No Typology Code available for payment source

## 2016-09-15 ENCOUNTER — Ambulatory Visit
Admission: RE | Admit: 2016-09-15 | Discharge: 2016-09-15 | Disposition: A | Discharge status: Home / Self Care | Payer: No Typology Code available for payment source | Source: Ambulatory Visit | Attending: Gynecology | Admitting: Gynecology

## 2016-09-15 DIAGNOSIS — Z1231 Encounter for screening mammogram for malignant neoplasm of breast: Secondary | ICD-10-CM

## 2017-02-17 IMAGING — MR MR LUMBAR SPINE W/O CM
4 of 5 series · 18 of 48 positions shown · non-contrast
Comparison: None.

CLINICAL DATA: 44-year-old female with lumbar back pain radiating
to the left hip, woke with symptoms a few weeks ago. No known
injury. Initial encounter.

EXAM:
MRI LUMBAR SPINE WITHOUT CONTRAST
TECHNIQUE: Multiplanar, multisequence MR imaging of the lumbar spine was
performed. No intravenous contrast was administered.

[Series 3: T2 · sagittal · 4.0mm · 0.55mm/px · 4 of 13 slices shown (1 of 2)]
[im 1/13]
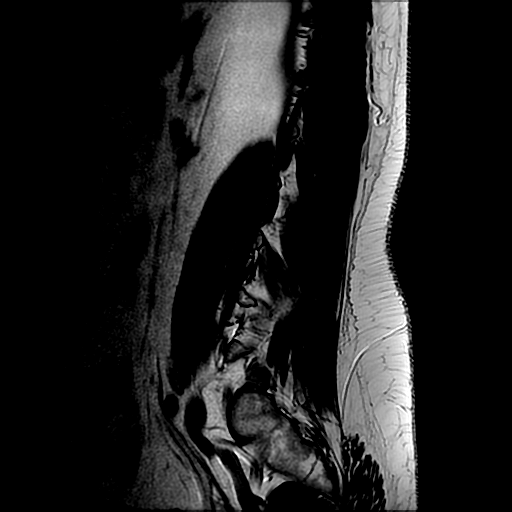
[im 5/13]
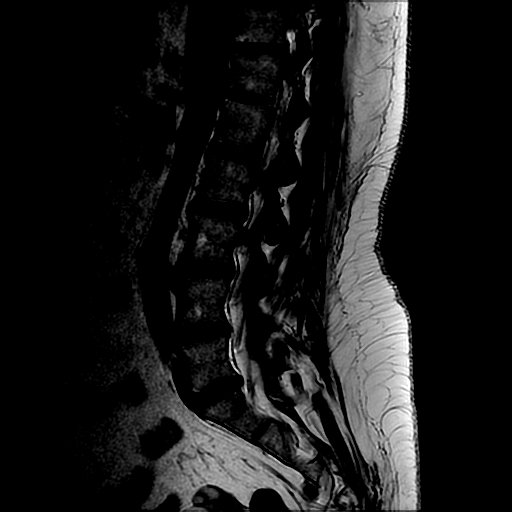
[im 9/13]
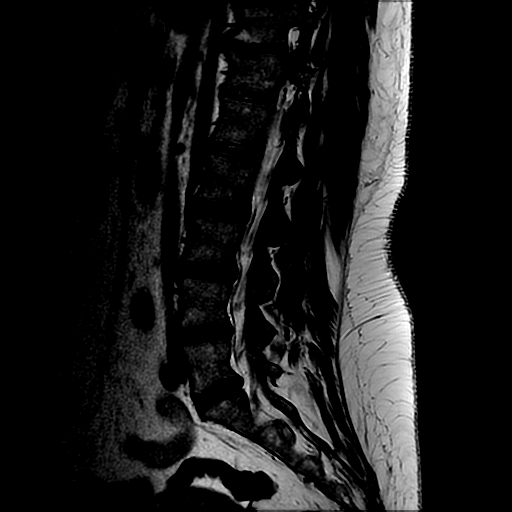
[im 13/13]
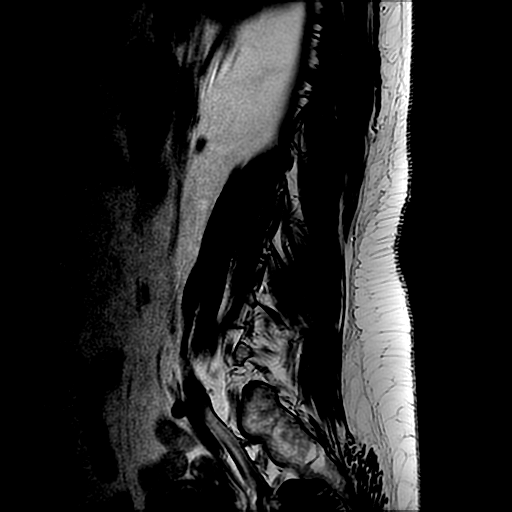

[Series 4: T1 · sagittal · 4.0mm · 0.55mm/px · 3 of 13 slices shown (1 of 2)]
[im 1/13]
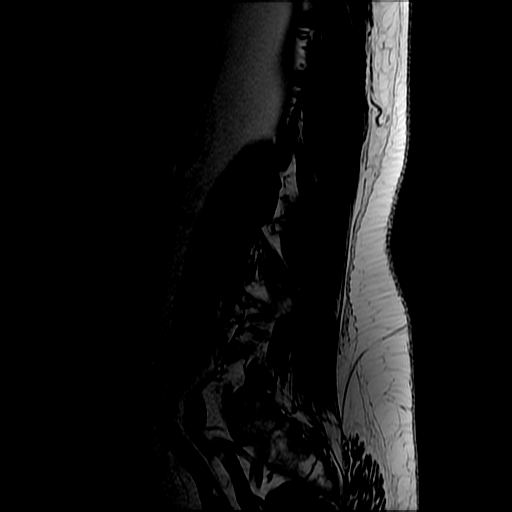
[im 7/13]
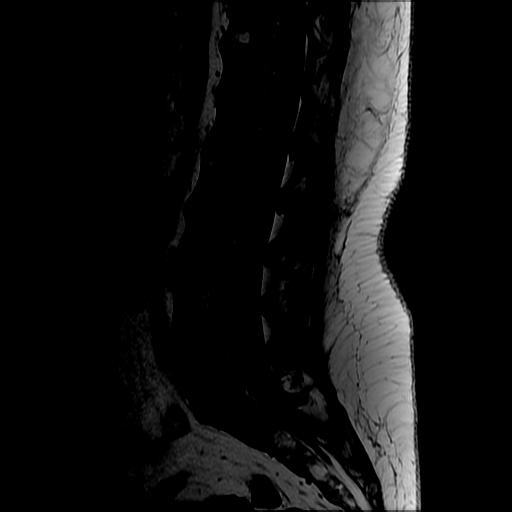
[im 13/13]
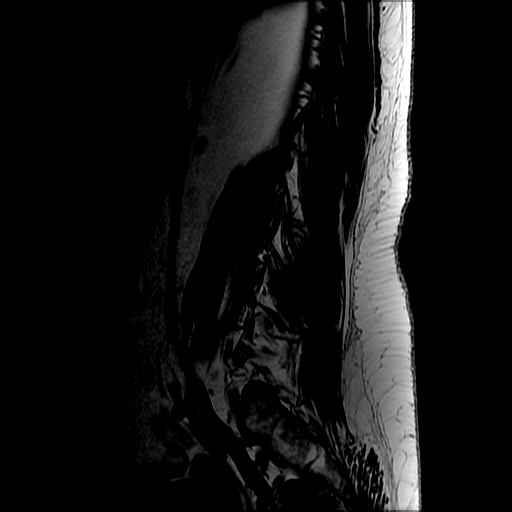

[Series 6: T2 · axial · 4.0mm · 0.39mm/px · z∈[-91,+112]mm · 8 of 44 slices shown (2 of 2)]
[im 3/44]
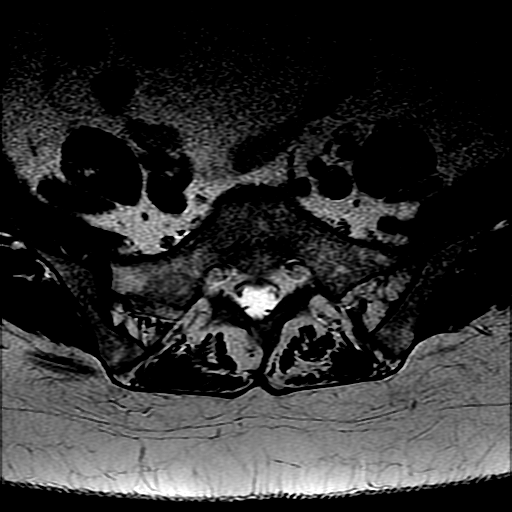
[im 6/44]
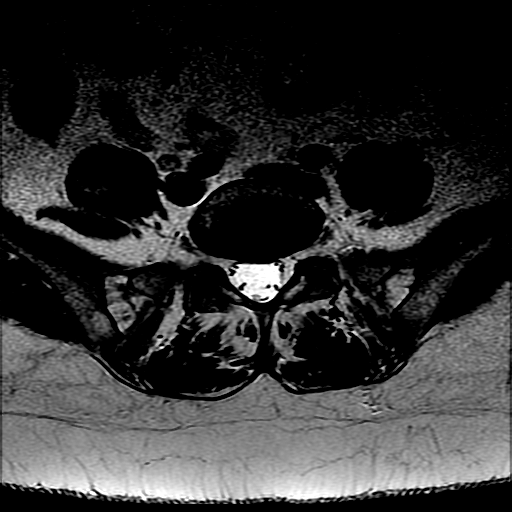
[im 9/44]
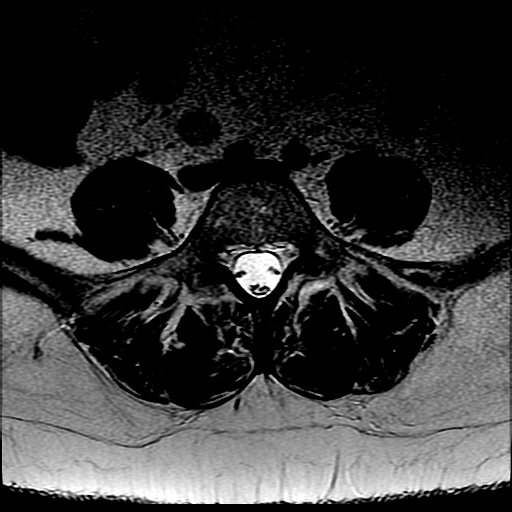
[im 14/44]
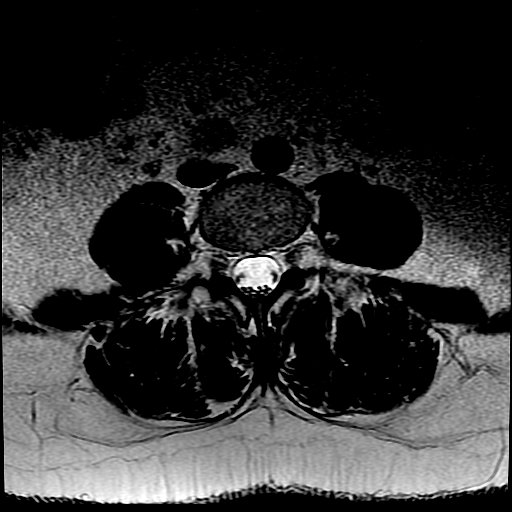
[im 19/44]
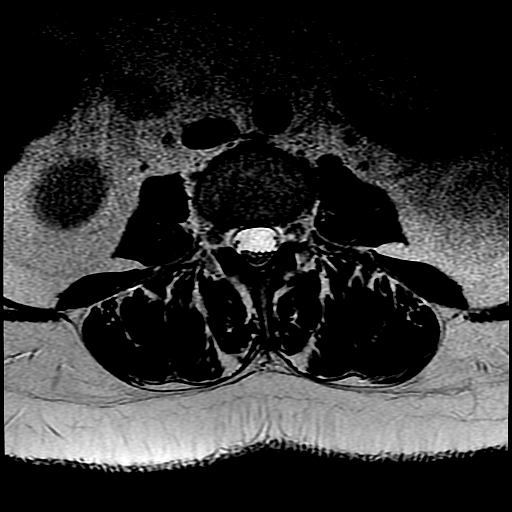
[im 22/44]
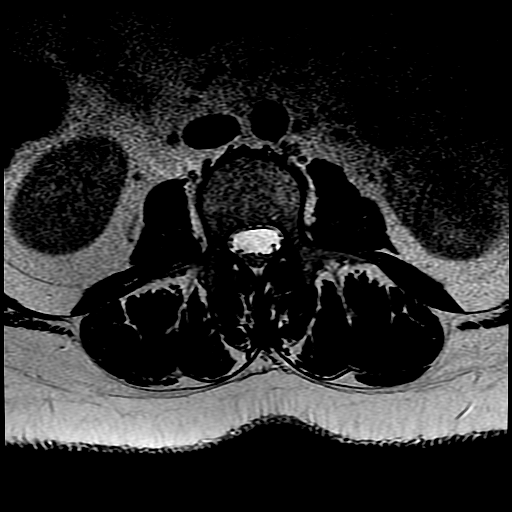
[im 25/44]
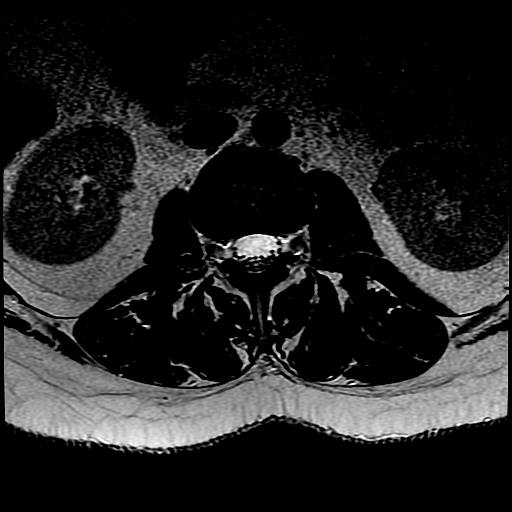
[im 38/44]
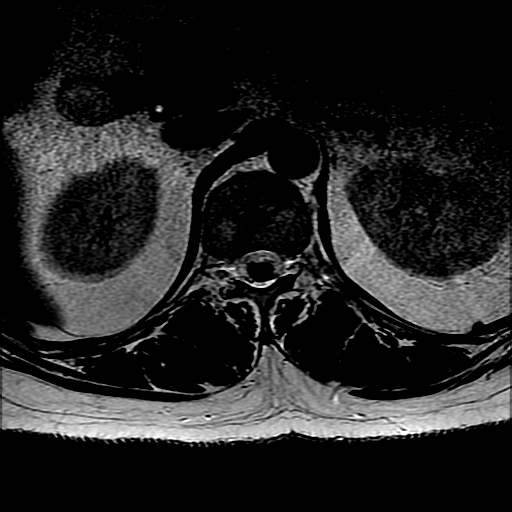

[Series 7: T1 · axial · 4.0mm · 0.39mm/px · z∈[-76,+112]mm · 3 of 44 slices shown (2 of 2)]
[im 6/44]
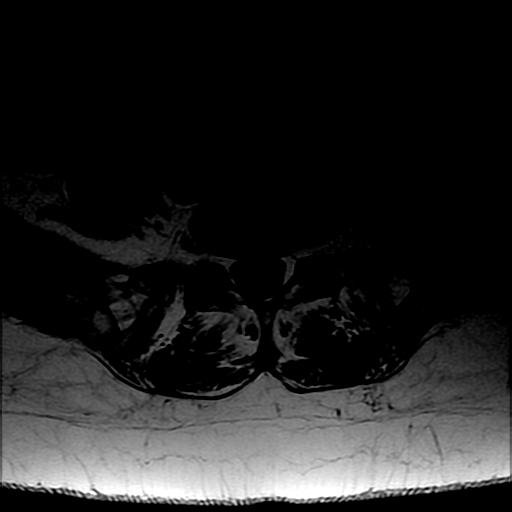
[im 22/44]
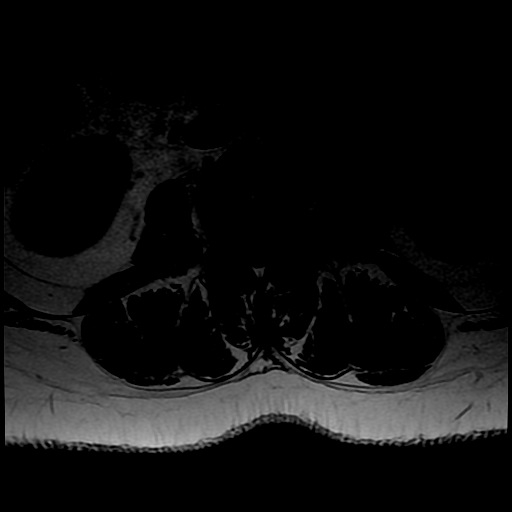
[im 38/44]
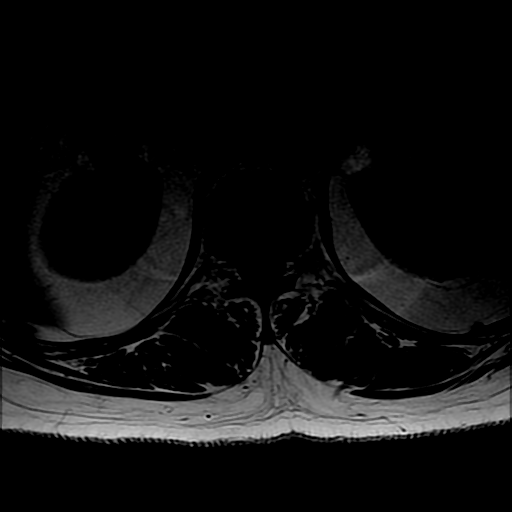

[18 of 48 positions shown; findings below may reference images not displayed]

FINDINGS: Lumbar segmentation appears to be normal and will be designated as
such for this report. Chronic posttraumatic or degenerative endplate
Schmorl's nodes in the lower thoracic and upper lumbar spine, most
pronounced T12. Otherwise preserved vertebral height and alignment.
No marrow edema or evidence of acute osseous abnormality.

Visualized lower thoracic spinal cord is normal with conus medularis
at L1-L2.

Visualized abdominal viscera and paraspinal soft tissues are within
normal limits.

T10-T11:  Partially visible, grossly negative.

T11-T12: Mild disc bulging and facet hypertrophy. No significant
stenosis.

T12-L1:  Negative aside from chronic endplate deformities.

L1-L2:  Negative aside from chronic endplate deformities.

L2-L3: Negative aside from mild chronic L2 inferior endplate
deformity.

L3-L4:  Minimal disc bulge.  Otherwise negative.

L4-L5: Mild disc desiccation. Subtle central and slightly caudal
disc protrusion best seen on series 3, image 7. No stenosis and
otherwise negative.

L5-S1: Mild disc desiccation. Subtle left paracentral and slightly
caudal disc protrusion with small annular fissure best seen on
series 3, image 8. This is in proximity to the descending left S1
nerve roots, but there is no nerve root mass effect. No stenosis and
otherwise negative.
IMPRESSION: 1. Very mild lower lumbar disc degeneration, including a subtle disc
herniation at L5-S1 which could be a source for left S1 radiculitis
despite no subsequent neural impingement.
2. Chronic lower thoracic and upper lumbar mild endplate deformities
could be posttraumatic or degenerative in nature.

## 2017-10-04 ENCOUNTER — Ambulatory Visit (INDEPENDENT_AMBULATORY_CARE_PROVIDER_SITE_OTHER): Payer: Self-pay | Admitting: Women's Health

## 2017-10-04 ENCOUNTER — Encounter: Payer: Self-pay | Admitting: Women's Health

## 2017-10-04 VITALS — Wt 193.0 lb

## 2017-10-04 DIAGNOSIS — N938 Other specified abnormal uterine and vaginal bleeding: Secondary | ICD-10-CM

## 2017-10-04 NOTE — Patient Instructions (Signed)
Histerosonografa (Sonohysterogram) La histerosonografa es un procedimiento que examina el interior del tero. En este examen, se envan ondas sonoras a una computadora para tomar imgenes en tiempo real del interior del tero. Para obtener las mejores imgenes, se inyecta una solucin salina sin grmenes (solucin salina estril) dentro del tero a travs de la vagina. La histerosonografa puede mostrar si hay cicatrices o tumores anormales dentro del tero. Tambin puede mostrar si el tero tiene forma anormal o si su revestimiento es muy delgado. INFORME A SU MDICO:  Cualquier alergia que tenga.  Todos los [employer] [place], incluyendo vitaminas, hierbas, gotas oftlmicas, cremas y medicamentos de venta libre.  Problemas previos que usted o los [employer] de su familia hayan tenido con el uso de anestsicos.  Enfermedades de la sangre que tenga.  Cirugas previas.  Enfermedades que tenga.  La fecha de su ltima menstruacin.  Probabilidad de embarazo.  RIESGOS Y COMPLICACIONES En general, la histerosonografa es un procedimiento seguro. Sin embargo, Engineer, technical sales, pueden surgir problemas. Algunos posibles problemas incluyen:  Hemorragia.  Infeccin. ANTES DEL [place] podr indicarle que tome un analgsico de [place].  Quizs le receten un antibitico.  Es posible que el mdico le haga una prueba de embarazo antes del procedimiento.  Deber vaciar la vejiga.  PROCEDIMIENTO  Deber Mady Haagensen en una camilla con las rodillas levantadas o los pies apoyados en estribos.  El mdico puede hacerle un examen plvico antes de comenzar el procedimiento.  Le colocarn en la vagina un dispositivo manual delgado (transductor), que previamente se lubricar.  El transductor se [place] de manera tal que enve ondas sonoras al tero. ? Estas ondas sonoras rebotarn en el transductor y se enviarn a una computadora. ? La computadora  convertir las ondas sonoras en imgenes en vivo. ? El mdico ver estas imgenes en una pantalla durante el procedimiento.  El mdico sacar el transductor de la vagina y usar un instrumento para ensanchar la apertura vaginal (espculo).  Se utilizar un hisopo para limpiar la apertura del tero (cuello del tero).  Luego se colocar un tubo delgado y flexible (catter) a travs del cuello del tero hasta el tero.  El mdico llenar el tero con solucin salina a travs del catter. Puede sentir algunos clicos.  Le sacarn el espculo.  Le colocarn de nuevo el transductor en la vagina para tomar ms imgenes.  DESPUS DEL PROCEDIMIENTO Despus del procedimiento, es habitual tener sangrado leve de la vagina, clicos y flujo acuoso. Esta informacin no tiene Marine scientist el consejo del mdico. Asegrese de hacerle al mdico cualquier pregunta que tenga. Document Released: 05/27/2013 Document Revised: 05/27/2013 Document Reviewed: 12/24/2012 Elsevier Interactive Patient Education  2017 [employer].

## 2017-10-04 NOTE — Progress Notes (Signed)
47 year old M HF G4, P4 presents with complaint of heavy menstrual cycles lasting 10 days, vaginal discharge greenish in color.   Has used over-the-counter Monistat with moderate relief.  States cycles have gotten increasingly heavier over the past year most lasting 8 to 10 days with large clots prior cycles were 5 days with occasional clots.  States had a normal cycle August 21 that did last about 10 days ended no bleeding for about 3 days and then started bleeding heavily after intercourse 2 days ago and now only spotting.  Minimal abdominal cramping.  Husband vasectomy.  Denies urinary symptoms, abdominal pain, nausea, change in routine, or fever.  2015 benign endometrial polyp removed per Dr. Toney Rakes, reports bleeding is similar to when polyp first diagnosed.  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   Exam: Appears well.  No CVAT.  Abdomen soft, obese, external genitalia mild erythema, speculum exam scant menses type blood present, wet prep negative.  Bimanual no CMT or adnexal tenderness.  Menorrhagia  Plan: Reviewed normality of wet prep, instructed to schedule sonohysterogram with Dr. Dellis Filbert.  Reviewed may have  another endometrial polyp.  Since bleeding is minimal today will watch at this time.

## 2017-10-18 ENCOUNTER — Other Ambulatory Visit: Payer: Self-pay | Admitting: Obstetrics & Gynecology

## 2017-10-18 DIAGNOSIS — N939 Abnormal uterine and vaginal bleeding, unspecified: Secondary | ICD-10-CM

## 2017-10-23 ENCOUNTER — Other Ambulatory Visit: Payer: Self-pay | Admitting: Obstetrics & Gynecology

## 2017-10-23 ENCOUNTER — Ambulatory Visit
Admission: RE | Admit: 2017-10-23 | Discharge: 2017-10-23 | Disposition: A | Discharge status: Home / Self Care | Payer: No Typology Code available for payment source | Source: Ambulatory Visit | Attending: Obstetrics & Gynecology | Admitting: Obstetrics & Gynecology

## 2017-10-23 DIAGNOSIS — Z1231 Encounter for screening mammogram for malignant neoplasm of breast: Secondary | ICD-10-CM

## 2017-11-01 ENCOUNTER — Ambulatory Visit (INDEPENDENT_AMBULATORY_CARE_PROVIDER_SITE_OTHER): Payer: Self-pay | Admitting: Obstetrics & Gynecology

## 2017-11-01 ENCOUNTER — Other Ambulatory Visit: Payer: Self-pay | Admitting: Obstetrics & Gynecology

## 2017-11-01 ENCOUNTER — Ambulatory Visit (INDEPENDENT_AMBULATORY_CARE_PROVIDER_SITE_OTHER): Payer: Self-pay

## 2017-11-01 ENCOUNTER — Telehealth: Payer: Self-pay

## 2017-11-01 DIAGNOSIS — N938 Other specified abnormal uterine and vaginal bleeding: Secondary | ICD-10-CM

## 2017-11-01 DIAGNOSIS — N920 Excessive and frequent menstruation with regular cycle: Secondary | ICD-10-CM

## 2017-11-01 DIAGNOSIS — D250 Submucous leiomyoma of uterus: Secondary | ICD-10-CM

## 2017-11-01 DIAGNOSIS — N939 Abnormal uterine and vaginal bleeding, unspecified: Secondary | ICD-10-CM

## 2017-11-01 NOTE — Progress Notes (Signed)
Gabrielle Cameron [facility] 31-Aug-1970 [id number]        47 y.o.  G4P4L4 Married.  Vasectomy  RP: Recurrence of heavy menses x 1 year  HPI: Heavier and heavier menstrual periods over the passed year.  H/O HSC/Excision of Polyp/Endometrial Ablation in 2015 with Dr Toney Rakes.  No pelvic pain. Normal vaginal secretions.   OB History  Gravida Para Term Preterm AB Living  4 4 4     4   SAB TAB Ectopic Multiple Live Births          4    # Outcome Date GA Lbr Len/2nd Weight Sex Delivery Anes PTL Lv  4 Term     F Vag-Spont  N LIV  3 Term     F Vag-Spont  N LIV  2 Term     F Vag-Spont  N LIV  1 Term     F Vag-Spont  N LIV    Past medical history,surgical history, problem list, medications, allergies, family history and social history were all reviewed and documented in the EPIC chart.   Directed ROS with pertinent positives and negatives documented in the history of present illness/assessment and plan.  Exam:  There were no vitals filed for this visit. General appearance:  Normal                                                                    Sono Infusion Hysterogram ( procedure note)   The initial transvaginal ultrasound demonstrated the following: T/V and T/a images.  Anteverted uterus measuring 10.10 x 7.33 x 5.80 cm.  Submucosal fibroid measuring 1.6 x 1.7 cm.  Endometrial lining sick at 21.2 mm.  Right ovary with a thin irregular wall and internal echoes measuring 2.2 x 1.9 cm.  Negative color flow Doppler.  Left ovary with a thin walled cyst with septations measuring 2.5 x 2.6 cm.  Negative color flow Doppler.  No free fluid in the posterior cul-de-sac.  The speculum  was inserted and the cervix cleansed with Betadine solution after confirming that patient has no allergies.A small sonohysterography catheterwas utilized.  Insertion was facilitated with ring forceps, using a spear-like motion the catheter was inserted to the fundus of the uterus. The speculum is then removed  carefully to avoid dislodging the catheter. The catheter was flushed with sterile saline delete prior to insertion to rid it of small amounts of air.the sterile saline solution was infused into the uterine cavity as a vaginal ultrasound probe was then placed in the vagina for full visualization of the uterine cavity from a transvaginal approach. The following was noted: Following injection of saline the endometrium is filled showing a right wall defect measuring 1.3 x 1.7 cm.  The endometrial lining excluding the fluid measures 14 mm.  The catheter was then removed after retrieving some of the saline from the intrauterine cavity. An endometrial biopsy was not done. Patient tolerated procedure well. She had received a tablet of Aleve for discomfort.    Assessment/Plan:  47 y.o. G4P4004   1. Menorrhagia with regular cycle History of hysteroscopy/endometrial polypectomy and endometrial ablation with NovaSure in 2015.  Given recurrence of heavy menstrual periods post ablation, patient declines repeating the same procedure.  Therefore, decision to proceed with LAVH/Bilateral Salpingectomy  at patient's preference.  Surgery and risks reviewed thoroughly with patient voiced understanding and agreement.  2. Fibroids, submucosal Sonohysterography showing a right wall defect measuring 1.3 x 1.7 cm compatible with a submucosal fibroid.                        Patient was counseled as to the risk of surgery to include the following:  1. Infection (prohylactic antibiotics will be administered)  2. DVT/Pulmonary Embolism (prophylactic pneumo compression stockings will be used)  3.Trauma to internal organs requiring additional surgical procedure to repair any injury to internal organs requiring perhaps additional hospitalization days.  4.Hemmorhage requiring transfusion and blood products which carry risks such as anaphylactic reaction, hepatitis and AIDS  Patient had received literature information on the  procedure scheduled and all her questions were answered and fully accepts all risk.  Counseling on above issues and coordination of care more than 50% for 25 minutes.  [name] MD, 10:01 AM 11/01/2017

## 2017-11-01 NOTE — Telephone Encounter (Signed)
Gabrielle Cameron called and spoke with patient about scheduling surgery. She discussed the estimated surgery prepymt amount after private pay discount and that is due by one week before surgery. Discussed available dates.  Patient wants to discuss with her  Husband and will call back.

## 2017-11-05 ENCOUNTER — Encounter: Payer: Self-pay | Admitting: Obstetrics & Gynecology

## 2017-11-05 NOTE — Patient Instructions (Signed)
1. Menorrhagia with regular cycle History of hysteroscopy/endometrial polypectomy and endometrial ablation with NovaSure in 2015.  Given recurrence of heavy menstrual periods post ablation, patient declines repeating the same procedure.  Therefore, decision to proceed with LAVH/Bilateral Salpingectomy at patient's preference.  Surgery and risks reviewed thoroughly with patient voiced understanding and agreement.  2. Fibroids, submucosal Sonohysterography showing a right wall defect measuring 1.3 x 1.7 cm compatible with a submucosal fibroid.                        Patient was counseled as to the risk of surgery to include the following:  1. Infection (prohylactic antibiotics will be administered)  2. DVT/Pulmonary Embolism (prophylactic pneumo compression stockings will be used)  3.Trauma to internal organs requiring additional surgical procedure to repair any injury to internal organs requiring perhaps additional hospitalization days.  4.Hemmorhage requiring transfusion and blood products which carry risks such as anaphylactic reaction, hepatitis and AIDS  Patient had received literature information on the procedure scheduled and all her questions were answered and fully accepts all risk.  Merrilyn Puma, fue un placer encontrarle hoy!

## 2017-11-07 NOTE — Telephone Encounter (Signed)
Patient called Rosemarie Ax back today and wanted to schedule for 11/28/17 7:30am at [facility]  Surgery scheduled. I will mail her pamphlet from [facility]. Claudia reminded her surgery prepymt due one week prior to surgery.

## 2017-11-09 ENCOUNTER — Encounter: Payer: Self-pay | Admitting: Obstetrics & Gynecology

## 2017-11-10 ENCOUNTER — Encounter: Payer: Self-pay | Admitting: Anesthesiology

## 2017-11-22 NOTE — Patient Instructions (Addendum)
Gabrielle Cameron  11/22/2017     Your procedure is scheduled on 11-28-17     Report to [facility]  at 5:30  A.M.    Call this number if you have problems the morning of [phone]    OUR ADDRESS IS [place], WE ARE LOCATED [facility] THE [facility] WITH [facility].   Remember:  Do not eat food or drink liquids after midnight.  Take these medicines the morning of surgery with A SIP OF WATER: Zyrtec (Cetirizine)   Do not wear jewelry, make-up or nail polish.  Do not wear lotions, powders, or perfumes, or deoderant.  Do not shave 48 hours prior to surgery.    Do not bring valuables to the hospital.  [facility] is not responsible for any belongings or valuables.  Contacts, dentures or bridgework may not be worn into surgery.  Leave your suitcase in the car.  After surgery it may be brought to your room.  For patients admitted to the hospital, discharge time will be determined by your treatment team.  Patients discharged the day of surgery will not be allowed to drive home.   Special instructions:  Please read over the following fact sheets that you were given:       [facility] - Preparing for Surgery Before surgery, you can play an important role.  Because skin is not sterile, your skin needs to be as free of germs as possible.  You can reduce the number of germs on your skin by washing with CHG (chlorahexidine gluconate) soap before surgery.  CHG is an antiseptic cleaner which kills germs and bonds with the skin to continue killing germs even after washing. Please DO NOT use if you have an allergy to CHG or antibacterial soaps.  If your skin becomes reddened/irritated stop using the CHG and inform your nurse when you arrive at Short Stay. Do not shave (including legs and underarms) for at least 48 hours prior to the first CHG shower.  You may shave your face/neck. Please follow these instructions carefully:  1.  Shower with  CHG Soap the night before surgery and the  morning of Surgery.  2.  If you choose to wash your hair, wash your hair first as usual with your  normal  shampoo.  3.  After you shampoo, rinse your hair and body thoroughly to remove the  shampoo.                           4.  Use CHG as you would any other liquid soap.  You can apply chg directly  to the skin and wash                       Gently with a scrungie or clean washcloth.  5.  Apply the CHG Soap to your body ONLY FROM THE NECK DOWN.   Do not use on face/ open                           Wound or open sores. Avoid contact with eyes, ears mouth and genitals (private parts).                       Wash face,  Genitals (private parts) with your normal soap.  6.  Wash thoroughly, paying special attention to the area where your surgery  will be performed.  7.  Thoroughly rinse your body with warm water from the neck down.  8.  DO NOT shower/wash with your normal soap after using and rinsing off  the CHG Soap.                9.  Pat yourself dry with a clean towel.            10.  Wear clean pajamas.            11.  Place clean sheets on your bed the night of your first shower and do not  sleep with pets. Day of Surgery : Do not apply any lotions/deodorants the morning of surgery.  Please wear clean clothes to the hospital/surgery center.  FAILURE TO FOLLOW THESE INSTRUCTIONS MAY RESULT IN THE CANCELLATION OF YOUR SURGERY PATIENT SIGNATURE_________________________________  NURSE SIGNATURE__________________________________  ________________________________________________________________________

## 2017-11-23 ENCOUNTER — Other Ambulatory Visit: Payer: Self-pay

## 2017-11-23 ENCOUNTER — Encounter (HOSPITAL_COMMUNITY): Payer: Self-pay

## 2017-11-23 ENCOUNTER — Encounter (HOSPITAL_COMMUNITY)
Admission: RE | Admit: 2017-11-23 | Discharge: 2017-11-23 | Disposition: A | Discharge status: Home / Self Care | Payer: No Typology Code available for payment source | Source: Ambulatory Visit | Attending: Obstetrics & Gynecology | Admitting: Obstetrics & Gynecology

## 2017-11-23 DIAGNOSIS — Z01812 Encounter for preprocedural laboratory examination: Secondary | ICD-10-CM | POA: Insufficient documentation

## 2017-11-23 DIAGNOSIS — D259 Leiomyoma of uterus, unspecified: Secondary | ICD-10-CM | POA: Insufficient documentation

## 2017-11-23 DIAGNOSIS — N920 Excessive and frequent menstruation with regular cycle: Secondary | ICD-10-CM | POA: Insufficient documentation

## 2017-11-23 LAB — CBC
HCT: 31.7 % — ABNORMAL LOW (ref 36.0–46.0)
Hemoglobin: 10.2 g/dL — ABNORMAL LOW (ref 12.0–15.0)
MCH: 22.3 pg — AB (ref 26.0–34.0)
MCHC: 32.2 g/dL (ref 30.0–36.0)
MCV: 69.4 fL — AB (ref 80.0–100.0)
NRBC: 0 % (ref 0.0–0.2)
PLATELETS: 288 10*3/uL (ref 150–400)
RBC: 4.57 MIL/uL (ref 3.87–5.11)
RDW: 15.7 % — AB (ref 11.5–15.5)
WBC: 9.8 10*3/uL (ref 4.0–10.5)

## 2017-11-23 LAB — ABO/RH: ABO/RH(D): O POS

## 2017-11-27 ENCOUNTER — Encounter: Payer: Self-pay | Admitting: Obstetrics & Gynecology

## 2017-11-28 ENCOUNTER — Other Ambulatory Visit: Payer: Self-pay | Admitting: Obstetrics & Gynecology

## 2017-11-28 ENCOUNTER — Encounter (HOSPITAL_BASED_OUTPATIENT_CLINIC_OR_DEPARTMENT_OTHER): Payer: Self-pay | Admitting: Emergency Medicine

## 2017-11-28 ENCOUNTER — Encounter (HOSPITAL_BASED_OUTPATIENT_CLINIC_OR_DEPARTMENT_OTHER)
Admission: RE | Disposition: A | Discharge status: Home / Self Care | Payer: Self-pay | Source: Ambulatory Visit | Attending: Obstetrics & Gynecology

## 2017-11-28 ENCOUNTER — Ambulatory Visit (HOSPITAL_BASED_OUTPATIENT_CLINIC_OR_DEPARTMENT_OTHER): Payer: Self-pay | Admitting: Anesthesiology

## 2017-11-28 ENCOUNTER — Ambulatory Visit (HOSPITAL_BASED_OUTPATIENT_CLINIC_OR_DEPARTMENT_OTHER)
Admission: RE | Admit: 2017-11-28 | Discharge: 2017-11-29 | Disposition: A | Discharge status: Home / Self Care | Payer: Self-pay | Source: Ambulatory Visit | Attending: Obstetrics & Gynecology | Admitting: Obstetrics & Gynecology

## 2017-11-28 ENCOUNTER — Other Ambulatory Visit: Payer: Self-pay

## 2017-11-28 DIAGNOSIS — N920 Excessive and frequent menstruation with regular cycle: Secondary | ICD-10-CM

## 2017-11-28 DIAGNOSIS — E559 Vitamin D deficiency, unspecified: Secondary | ICD-10-CM | POA: Insufficient documentation

## 2017-11-28 DIAGNOSIS — M1990 Unspecified osteoarthritis, unspecified site: Secondary | ICD-10-CM | POA: Insufficient documentation

## 2017-11-28 DIAGNOSIS — D250 Submucous leiomyoma of uterus: Secondary | ICD-10-CM | POA: Insufficient documentation

## 2017-11-28 DIAGNOSIS — Z79899 Other long term (current) drug therapy: Secondary | ICD-10-CM | POA: Insufficient documentation

## 2017-11-28 DIAGNOSIS — N840 Polyp of corpus uteri: Secondary | ICD-10-CM

## 2017-11-28 DIAGNOSIS — N800 Endometriosis of uterus: Secondary | ICD-10-CM | POA: Insufficient documentation

## 2017-11-28 DIAGNOSIS — Z9889 Other specified postprocedural states: Secondary | ICD-10-CM

## 2017-11-28 DIAGNOSIS — E785 Hyperlipidemia, unspecified: Secondary | ICD-10-CM | POA: Insufficient documentation

## 2017-11-28 DIAGNOSIS — N803 Endometriosis of pelvic peritoneum: Secondary | ICD-10-CM | POA: Insufficient documentation

## 2017-11-28 HISTORY — PX: LAPAROSCOPIC VAGINAL HYSTERECTOMY WITH SALPINGECTOMY: SHX6680

## 2017-11-28 LAB — TYPE AND SCREEN
ABO/RH(D): O POS
Antibody Screen: NEGATIVE

## 2017-11-28 LAB — POCT PREGNANCY, URINE: PREG TEST UR: NEGATIVE

## 2017-11-28 SURGERY — HYSTERECTOMY, VAGINAL, LAPAROSCOPY-ASSISTED, WITH SALPINGECTOMY
Anesthesia: General | Laterality: Bilateral

## 2017-11-28 MED ORDER — SUGAMMADEX SODIUM 200 MG/2ML IV SOLN
INTRAVENOUS | Status: DC | PRN
  Administered 2017-11-28: 200 mg via INTRAVENOUS

## 2017-11-28 MED ORDER — OXYCODONE-ACETAMINOPHEN 5-325 MG PO TABS
ORAL_TABLET | ORAL | Status: AC
  Filled 2017-11-28: qty 1

## 2017-11-28 MED ORDER — FENTANYL CITRATE (PF) 100 MCG/2ML IJ SOLN
INTRAMUSCULAR | Status: DC | PRN
  Administered 2017-11-28 (×2): 50 ug via INTRAVENOUS
  Administered 2017-11-28: 100 ug via INTRAVENOUS
  Administered 2017-11-28: 50 ug via INTRAVENOUS

## 2017-11-28 MED ORDER — PROPOFOL 10 MG/ML IV BOLUS
INTRAVENOUS | Status: DC | PRN
  Administered 2017-11-28: 170 mg via INTRAVENOUS
  Administered 2017-11-28: 30 mg via INTRAVENOUS

## 2017-11-28 MED ORDER — FENTANYL CITRATE (PF) 250 MCG/5ML IJ SOLN
INTRAMUSCULAR | Status: AC
  Filled 2017-11-28: qty 5

## 2017-11-28 MED ORDER — LIDOCAINE 2% (20 MG/ML) 5 ML SYRINGE
INTRAMUSCULAR | Status: DC | PRN
  Administered 2017-11-28: 60 mg via INTRAVENOUS

## 2017-11-28 MED ORDER — SCOPOLAMINE 1 MG/3DAYS TD PT72
MEDICATED_PATCH | TRANSDERMAL | Status: DC | PRN
  Administered 2017-11-28: 1 via TRANSDERMAL

## 2017-11-28 MED ORDER — ONDANSETRON HCL 4 MG/2ML IJ SOLN
INTRAMUSCULAR | Status: DC | PRN
  Administered 2017-11-28 (×2): 4 mg via INTRAVENOUS

## 2017-11-28 MED ORDER — DEXAMETHASONE SODIUM PHOSPHATE 10 MG/ML IJ SOLN
INTRAMUSCULAR | Status: AC
  Filled 2017-11-28: qty 1

## 2017-11-28 MED ORDER — ROCURONIUM BROMIDE 10 MG/ML (PF) SYRINGE
PREFILLED_SYRINGE | INTRAVENOUS | Status: AC
  Filled 2017-11-28: qty 10

## 2017-11-28 MED ORDER — HYDROMORPHONE HCL 1 MG/ML IJ SOLN
INTRAMUSCULAR | Status: AC
  Filled 2017-11-28: qty 1

## 2017-11-28 MED ORDER — KETOROLAC TROMETHAMINE 30 MG/ML IJ SOLN
INTRAMUSCULAR | Status: AC
  Filled 2017-11-28: qty 1

## 2017-11-28 MED ORDER — CEFAZOLIN SODIUM-DEXTROSE 2-4 GM/100ML-% IV SOLN
2.0000 g | INTRAVENOUS | Status: AC
  Administered 2017-11-28: 2 g via INTRAVENOUS
  Filled 2017-11-28: qty 100

## 2017-11-28 MED ORDER — DEXAMETHASONE SODIUM PHOSPHATE 10 MG/ML IJ SOLN
INTRAMUSCULAR | Status: DC | PRN
  Administered 2017-11-28: 10 mg via INTRAVENOUS

## 2017-11-28 MED ORDER — KETOROLAC TROMETHAMINE 30 MG/ML IJ SOLN
30.0000 mg | Freq: Once | INTRAMUSCULAR | Status: DC | PRN
  Filled 2017-11-28: qty 1

## 2017-11-28 MED ORDER — ATORVASTATIN CALCIUM 10 MG PO TABS: 10.0000 mg | ORAL_TABLET | Freq: Every day | ORAL | 11 refills | Status: DC

## 2017-11-28 MED ORDER — LACTATED RINGERS IV SOLN
INTRAVENOUS | Status: DC
  Administered 2017-11-28 – 2017-11-29 (×3): via INTRAVENOUS
  Filled 2017-11-28: qty 1000

## 2017-11-28 MED ORDER — MIDAZOLAM HCL 2 MG/2ML IJ SOLN
INTRAMUSCULAR | Status: AC
  Filled 2017-11-28: qty 2

## 2017-11-28 MED ORDER — IBUPROFEN 600 MG PO TABS
600.0000 mg | ORAL_TABLET | Freq: Four times a day (QID) | ORAL | Status: DC | PRN
  Filled 2017-11-28: qty 1

## 2017-11-28 MED ORDER — ONDANSETRON HCL 4 MG/2ML IJ SOLN
INTRAMUSCULAR | Status: AC
  Filled 2017-11-28: qty 2

## 2017-11-28 MED ORDER — ARTIFICIAL TEARS OPHTHALMIC OINT
TOPICAL_OINTMENT | OPHTHALMIC | Status: AC
  Filled 2017-11-28: qty 3.5

## 2017-11-28 MED ORDER — PROPOFOL 10 MG/ML IV BOLUS
INTRAVENOUS | Status: AC
  Filled 2017-11-28: qty 40

## 2017-11-28 MED ORDER — SUGAMMADEX SODIUM 200 MG/2ML IV SOLN
INTRAVENOUS | Status: AC
  Filled 2017-11-28: qty 2

## 2017-11-28 MED ORDER — ROCURONIUM BROMIDE 10 MG/ML (PF) SYRINGE
PREFILLED_SYRINGE | INTRAVENOUS | Status: DC | PRN
  Administered 2017-11-28 (×2): 10 mg via INTRAVENOUS
  Administered 2017-11-28: 60 mg via INTRAVENOUS

## 2017-11-28 MED ORDER — OXYCODONE-ACETAMINOPHEN 5-325 MG PO TABS
2.0000 | ORAL_TABLET | ORAL | Status: DC | PRN
  Administered 2017-11-28 (×2): 1 via ORAL
  Administered 2017-11-29: 2 via ORAL
  Filled 2017-11-28: qty 2

## 2017-11-28 MED ORDER — IBUPROFEN 200 MG PO TABS
ORAL_TABLET | ORAL | Status: AC
  Filled 2017-11-28: qty 3

## 2017-11-28 MED ORDER — TRAMADOL HCL 50 MG PO TABS
50.0000 mg | ORAL_TABLET | Freq: Four times a day (QID) | ORAL | Status: DC | PRN
  Filled 2017-11-28: qty 1

## 2017-11-28 MED ORDER — SODIUM CHLORIDE 0.9 % IR SOLN
Status: DC | PRN
  Administered 2017-11-28: 3000 mL

## 2017-11-28 MED ORDER — HYDROMORPHONE HCL 1 MG/ML IJ SOLN
0.2500 mg | INTRAMUSCULAR | Status: DC | PRN
  Administered 2017-11-28 (×3): 0.5 mg via INTRAVENOUS
  Filled 2017-11-28: qty 0.5

## 2017-11-28 MED ORDER — PROMETHAZINE HCL 25 MG/ML IJ SOLN
6.2500 mg | INTRAMUSCULAR | Status: DC | PRN
  Filled 2017-11-28: qty 1

## 2017-11-28 MED ORDER — LIDOCAINE-EPINEPHRINE (PF) 1 %-1:200000 IJ SOLN
INTRAMUSCULAR | Status: DC | PRN
  Administered 2017-11-28: 18 mL

## 2017-11-28 MED ORDER — HYDROMORPHONE HCL 1 MG/ML IJ SOLN
0.5000 mg | INTRAMUSCULAR | Status: DC | PRN
  Filled 2017-11-28: qty 0.5

## 2017-11-28 MED ORDER — CEFAZOLIN SODIUM-DEXTROSE 2-4 GM/100ML-% IV SOLN
INTRAVENOUS | Status: AC
  Filled 2017-11-28: qty 100

## 2017-11-28 MED ORDER — SCOPOLAMINE 1 MG/3DAYS TD PT72
MEDICATED_PATCH | TRANSDERMAL | Status: AC
  Filled 2017-11-28: qty 1

## 2017-11-28 MED ORDER — KETOROLAC TROMETHAMINE 30 MG/ML IJ SOLN
INTRAMUSCULAR | Status: DC | PRN
  Administered 2017-11-28: 30 mg via INTRAVENOUS

## 2017-11-28 MED ORDER — BUPIVACAINE HCL (PF) 0.25 % IJ SOLN
INTRAMUSCULAR | Status: DC | PRN
  Administered 2017-11-28: 10 mL

## 2017-11-28 MED ORDER — LIDOCAINE 2% (20 MG/ML) 5 ML SYRINGE
INTRAMUSCULAR | Status: AC
  Filled 2017-11-28: qty 5

## 2017-11-28 MED ORDER — MIDAZOLAM HCL 2 MG/2ML IJ SOLN
INTRAMUSCULAR | Status: DC | PRN
  Administered 2017-11-28: 2 mg via INTRAVENOUS

## 2017-11-28 SURGICAL SUPPLY — 49 items
APPLICATOR ARISTA FLEXITIP XL (MISCELLANEOUS) ×2 IMPLANT
APPLICATOR COTTON TIP 6IN STRL (MISCELLANEOUS) ×2 IMPLANT
CABLE HIGH FREQUENCY MONO STRZ (ELECTRODE) IMPLANT
CATH ROBINSON RED A/P 16FR (CATHETERS) IMPLANT
CONT PATH 16OZ SNAP LID 3702 (MISCELLANEOUS) ×2 IMPLANT
COVER BACK TABLE 60X90IN (DRAPES) ×2 IMPLANT
COVER MAYO STAND STRL (DRAPES) ×4 IMPLANT
COVER WAND RF STERILE (DRAPES) ×2 IMPLANT
DECANTER SPIKE VIAL GLASS SM (MISCELLANEOUS) IMPLANT
DERMABOND ADVANCED (GAUZE/BANDAGES/DRESSINGS) ×1
DERMABOND ADVANCED .7 DNX12 (GAUZE/BANDAGES/DRESSINGS) ×1 IMPLANT
DRSG COVADERM PLUS 2X2 (GAUZE/BANDAGES/DRESSINGS) IMPLANT
DRSG OPSITE POSTOP 3X4 (GAUZE/BANDAGES/DRESSINGS) ×2 IMPLANT
DURAPREP 26ML APPLICATOR (WOUND CARE) ×2 IMPLANT
ELECT REM PT RETURN 9FT ADLT (ELECTROSURGICAL) ×2
ELECTRODE REM PT RTRN 9FT ADLT (ELECTROSURGICAL) ×1 IMPLANT
GAUZE 4X4 16PLY RFD (DISPOSABLE) ×2 IMPLANT
GAUZE PACKING IODOFORM 1X5 (MISCELLANEOUS) IMPLANT
GLOVE BIO SURGEON STRL SZ 6.5 (GLOVE) ×4 IMPLANT
GLOVE BIOGEL PI IND STRL 7.0 (GLOVE) ×4 IMPLANT
GLOVE BIOGEL PI INDICATOR 7.0 (GLOVE) ×4
HEMOSTAT ARISTA ABSORB 3G PWDR (MISCELLANEOUS) ×2 IMPLANT
NEEDLE MAYO CATGUT SZ4 (NEEDLE) ×2 IMPLANT
PACK LAVH (CUSTOM PROCEDURE TRAY) ×2 IMPLANT
PACK ROBOTIC GOWN (GOWN DISPOSABLE) ×2 IMPLANT
PACK TRENDGUARD 450 HYBRID PRO (MISCELLANEOUS) ×1 IMPLANT
POUCH LAPAROSCOPIC INSTRUMENT (MISCELLANEOUS) ×2 IMPLANT
PROTECTOR NERVE ULNAR (MISCELLANEOUS) ×4 IMPLANT
SCISSORS LAP 5X35 DISP (ENDOMECHANICALS) IMPLANT
SEALER TISSUE G2 CVD JAW 35 (ENDOMECHANICALS) IMPLANT
SEALER TISSUE G2 CVD JAW 45CM (ENDOMECHANICALS)
SET IRRIG TUBING LAPAROSCOPIC (IRRIGATION / IRRIGATOR) ×2 IMPLANT
SHEARS HARMONIC ACE PLUS 36CM (ENDOMECHANICALS) ×2 IMPLANT
SLEEVE XCEL OPT CAN 5 100 (ENDOMECHANICALS) IMPLANT
SOLUTION ELECTROLUBE (MISCELLANEOUS) IMPLANT
SUT CHROMIC 0 CT 1 (SUTURE) IMPLANT
SUT MNCRL AB 4-0 PS2 18 (SUTURE) ×2 IMPLANT
SUT VIC AB 0 CT1 18XCR BRD8 (SUTURE) ×3 IMPLANT
SUT VIC AB 0 CT1 27 (SUTURE) ×1
SUT VIC AB 0 CT1 27XBRD ANBCTR (SUTURE) ×1 IMPLANT
SUT VIC AB 0 CT1 8-18 (SUTURE) ×3
SUT VICRYL 0 TIES 12 18 (SUTURE) ×2 IMPLANT
SUT VICRYL 0 UR6 27IN ABS (SUTURE) ×4 IMPLANT
TOWEL OR 17X24 6PK STRL BLUE (TOWEL DISPOSABLE) ×4 IMPLANT
TRAY FOLEY W/BAG SLVR 14FR (SET/KITS/TRAYS/PACK) ×2 IMPLANT
TRENDGUARD 450 HYBRID PRO PACK (MISCELLANEOUS) ×2
TROCAR BALLN 12MMX100 BLUNT (TROCAR) ×2 IMPLANT
TROCAR XCEL NON-BLD 5MMX100MML (ENDOMECHANICALS) ×4 IMPLANT
TUBING INSUF HEATED (TUBING) ×2 IMPLANT

## 2017-11-28 NOTE — Anesthesia Postprocedure Evaluation (Signed)
Anesthesia Post Note  Patient: Gabrielle Cameron  Procedure(s) Performed: LAPAROSCOPIC ASSISTED VAGINAL HYSTERECTOMY WITH SALPINGECTOMY AND LEFT OOPHERECTOMY (Bilateral )     Patient location during evaluation: PACU Anesthesia Type: General Level of consciousness: awake and alert Pain management: pain level controlled Vital Signs Assessment: post-procedure vital signs reviewed and stable Respiratory status: spontaneous breathing, nonlabored ventilation, respiratory function stable and patient connected to nasal cannula oxygen Cardiovascular status: blood pressure returned to baseline and stable Postop Assessment: no apparent nausea or vomiting Anesthetic complications: no    Last Vitals:  Vitals:   11/28/17 1158 11/28/17 1230  BP: 113/69 119/76  Pulse: 90 86  Resp: 20 18  Temp: 36.9 C 36.6 C  SpO2: 95% 98%    Last Pain:  Vitals:   11/28/17 1230  TempSrc:   PainSc: 1                  Talon Regala S

## 2017-11-28 NOTE — Progress Notes (Signed)
Atorvastatin represcribed.

## 2017-11-28 NOTE — Anesthesia Preprocedure Evaluation (Addendum)
Anesthesia Evaluation  Patient identified by MRN, date of birth, ID band Patient awake    Reviewed: Allergy & Precautions, NPO status , Patient's Chart, lab work & pertinent test results  Airway Mallampati: II  TM Distance: >3 FB Neck ROM: Full    Dental no notable dental hx. (+) Teeth Intact, Dental Advisory Given   Pulmonary neg pulmonary ROS,    Pulmonary exam normal breath sounds clear to auscultation       Cardiovascular negative cardio ROS Normal cardiovascular exam Rhythm:Regular Rate:Normal     Neuro/Psych negative neurological ROS  negative psych ROS   GI/Hepatic negative GI ROS, Neg liver ROS,   Endo/Other  negative endocrine ROS  Renal/GU negative Renal ROS  negative genitourinary   Musculoskeletal negative musculoskeletal ROS (+)   Abdominal   Peds negative pediatric ROS (+)  Hematology  (+) anemia ,   Anesthesia Other Findings Pt speaks and understands English well.  Reproductive/Obstetrics negative OB ROS                           Anesthesia Physical Anesthesia Plan  ASA: II  Anesthesia Plan: General   Post-op Pain Management:    Induction: Intravenous  PONV Risk Score and Plan: 3 and Ondansetron, Dexamethasone, Scopolamine patch - Pre-op and Treatment may vary due to age or medical condition  Airway Management Planned: Oral ETT  Additional Equipment:   Intra-op Plan:   Post-operative Plan: Extubation in OR  Informed Consent: I have reviewed the patients History and Physical, chart, labs and discussed the procedure including the risks, benefits and alternatives for the proposed anesthesia with the patient or authorized representative who has indicated his/her understanding and acceptance.   Dental advisory given  Plan Discussed with: CRNA and Surgeon  Anesthesia Plan Comments:         Anesthesia Quick Evaluation

## 2017-11-28 NOTE — Op Note (Signed)
Operative Note  11/28/2017  10:25 AM  PATIENT:  Gabrielle Cameron  47 y.o. female  PRE-OPERATIVE DIAGNOSIS:  Menorrhagia post endometrial ablation, fibroids, Pelvic Pain  POST-OPERATIVE DIAGNOSIS:  menorrhagia post endometrial ablation, fibroids, Pelvic Pain, Severe Pelvic Endometriosis  PROCEDURE:  Procedure(s): LAPAROSCOPIC ASSISTED VAGINAL HYSTERECTOMY WITH BILATERAL SALPINGECTOMY AND LEFT OOPHERECTOMY, LYSIS OF ADHESIONS  SURGEON:  Surgeon(s): [name], MD Anastasio Auerbach, MD  ANESTHESIA:   general  FINDINGS: Uterus with Fibroids, Right Tube and Ovary normal, Left Tube and Left Ovary with Endometriosis and Adhesions with the posterior Uterus and Left Ovarian Fossa.  Endometriosis involving the Left Utero-Sacral Ligament.  DESCRIPTION OF OPERATION: Under general anesthesia with endotracheal intubation the patient is in lithotomy position.  She is prepped with DuraPrep on the abdomen and with Betadine on the supra pubic, vulvar and vaginal areas.  She is draped as usual.  The vaginal exam reveals an anteverted uterus mildly increased in size.  The speculum is inserted in the vagina.  A uterine cannula is inserted with a one tooth tenaculum.  The speculum was removed.  The Foley is inserted in the bladder.  We go to the abdomen.  The supraumbilical area is infiltrated with Marcaine one quarter plain.  We make a 1.5 cm incision at that level with the skull pill.  The aponeurosis is grasped with Coker's and the aponeurosis is opened with Mayo scissors.  The parietal peritoneum is opened bluntly with the finger.  A pursestring stitch of Vicryl 0 was done on the aponeurosis.  The Sheryle Hail is inserted under direct vision at that level.  Up pneumoperitoneum is created with CO2.  The camera was inserted.  The anterior wall of the abdomen and pelvis is free of adhesions.  We infiltrate the port sites on the right and left with Marcaine one quarter plain.  We made 5 mm incisions on  each side.  5 mm ports are inserted on the right than on the left under direct vision.  The patient is placed in Trendelenburg.  The liver and appendix are normal.  Pictures are taken.  In the pelvis, the uterus is mildly increased in volume with fibroids.  The right tube and ovary are normal in appearance and size.  The left tube and ovary are very adherent to the posterior uterus and the left ovarian fossa.  Lesions of endometriosis are present involving the left adnexa and the left ureteral sacral ligament.  Pictures were taken of all the pelvic organs.  Both ureters are seen in normal anatomic position with peristalsis.  We start on the right side with the harmonic scalpel, cauterizing and sectioning the right mesosalpinx, cauterizing the right round ligament, cauterizing the right utero-ovarian ligament.  We then opened the visceral peritoneum anteriorly and started descending the bladder.  On the left side, we started with lysis of adhesion between the ovary and the left ovarian fossa using the harmonic scalpel pill.  We were then able to cauterized and section the left infundibulopelvic ligament.  We then proceeded with lysis of adhesions between the tube, ovary and posterior left uterus using the harmonic scalpel.  We then cauterized and section just under the left ovary reaching the left lateral uterus.  We used the bipolar to complete hemostasis at the left infundibulopelvic ligament.  We then cauterized and section the left round ligament.  We completed the opening of the visceral peritoneum anteriorly and further descended the bladder.  Hemostasis was adequate at all levels.  We went to  vaginal time.      The patient was repositioned in lithotomy position.  A weighted speculum was inserted in the vagina.  The uterine cannula was removed.  We grasped the cervix with 2 Jacob clamps.  The junction between the cervix and the vagina was infiltrated with lidocaine/epinephrine.  We then used the Bovie to open  the junction circumferentially.  We then pushed the vagina over the cervix anteriorly and open the peritoneum at that level.  The retractor was put in place to protect the bladder anteriorly.  We then opened the peritoneum posteriorly and inserted a long narrow weighted speculum at that level.  We started on the right side with a curved Heaney clamping, sectioning with Mayo scissors and suturing with a Heaney point of Vicryl 0.  That suture was kept on a hemostat.  We then clamped the right uterine artery with a Heaney clamp, sectioned with Mayo scissors and sutured with a Vicryl 0.  We proceeded the same way on the left side.  We then cored the uterus with a knife removing the cervix and parts of the lower uterus to reduce the bulk and access the uterus more easily.  We then continued to clamp section and suture until the uterus was completely detached.  The specimen including the cervix the uterus both tubes and the left ovary were sent to pathology.  We verified hemostasis on all pedicles and it was adequate.  We then closed the vaginal vault.  Suspension sutures were done at each angle including the uterosacral ligaments with a Vicryl 0.  We then did a locked running suture of Vicryl 0 in the posterior vaginal vault.  We then closed the vagina with figure-of-eights using Vicryl 0.  Hemostasis was adequate.  The weighted speculum was removed.  We went back to laparoscopy time.      We irrigated and suctioned the abdominal pelvic cavity.  Hemostasis was adequate after completing superficially with the bipolar at the right and left adnexae.  Arista was spread on the surgical sites bilaterally in the pelvis.  All laparoscopic instruments were removed.  The ports were removed under direct vision.  The CO2 was evacuated.  The aponeurosis of the supraumbilical incision was closed by attaching the pursestring stitch.  All incisions were closed at the skin with separate stitches of Monocryl 4-0.  Dermabond was added on  all incisions.  The patient was brought to recovery room in good and stable status.   ESTIMATED BLOOD LOSS: 125 mL   Intake/Output Summary (Last 24 hours) at 11/28/2017 1025 Last data filed at 11/28/2017 1016 Gross per 24 hour  Intake 1300 ml  Output 275 ml  Net 1025 ml     BLOOD ADMINISTERED:none   LOCAL MEDICATIONS USED:  MARCAINE/LIDOCAINE-EPINEPHRINE  SPECIMEN:  Source of Specimen:  Uterus, bilateral tubes and left ovary  DISPOSITION OF SPECIMEN:  PATHOLOGY  COUNTS:  YES  PLAN OF CARE: Transfer to PACU  Marie-Lyne LavoieMD10:25 AM

## 2017-11-28 NOTE — Anesthesia Procedure Notes (Signed)
Procedure Name: Intubation Date/Time: 11/28/2017 7:27 AM Performed by: Wanita Chamberlain, CRNA Pre-anesthesia Checklist: Patient identified, Emergency Drugs available, Suction available, Patient being monitored and Timeout performed Patient Re-evaluated:Patient Re-evaluated prior to induction Oxygen Delivery Method: Circle system utilized Preoxygenation: Pre-oxygenation with 100% oxygen Induction Type: IV induction Ventilation: Mask ventilation without difficulty Laryngoscope Size: Mac and 3 Grade View: Grade I Tube type: Oral Tube size: 7.0 mm Number of attempts: 1 Airway Equipment and Method: Stylet Placement Confirmation: breath sounds checked- equal and bilateral,  CO2 detector and positive ETCO2 Secured at: 21 cm Tube secured with: Tape Dental Injury: Teeth and Oropharynx as per pre-operative assessment

## 2017-11-28 NOTE — Transfer of Care (Signed)
Immediate Anesthesia Transfer of Care Note  Patient: Gabrielle Cameron  Procedure(s) Performed: LAPAROSCOPIC ASSISTED VAGINAL HYSTERECTOMY WITH SALPINGECTOMY AND LEFT OOPHERECTOMY (Bilateral )  Patient Location: PACU  Anesthesia Type:General  Level of Consciousness: awake, alert , oriented and patient cooperative  Airway & Oxygen Therapy: Patient Spontanous Breathing and Patient connected to nasal cannula oxygen  Post-op Assessment: Report given to RN and Post -op Vital signs reviewed and stable  Post vital signs: Reviewed and stable  Last Vitals:  Vitals Value Taken Time  BP    Temp    Pulse 84 11/28/2017 10:14 AM  Resp    SpO2 99 % 11/28/2017 10:14 AM  Vitals shown include unvalidated device data.  Last Pain:  Vitals:   11/28/17 0533  TempSrc: Oral      Patients Stated Pain Goal: 4 (76/72/09 4709)  Complications: No apparent anesthesia complications

## 2017-11-28 NOTE — H&P (Signed)
Gabrielle Cameron is an 47 y.o. female.  G46P4L4 Married.  Vasectomy  RP: Menorrhagia/Fibroid/Polyps for LAVH/Bilateral Salpingectomy  HPI: Heavier and heavier menstrual periods over the passed year.  H/O HSC/Excision of Polyp/Endometrial Ablation in 2015 with Dr Toney Rakes.  No pelvic pain. Normal vaginal secretions.  Pertinent Gynecological History: Menses: Menorrhagia Contraception: vasectomy Blood transfusions: none Sexually transmitted diseases: no past history Previous GYN Procedures: HSC/Myosure/Endometrial Ablation 2015  Last mammogram: 09/2017 Negative  Last pap: 07/2015 Negative OB History: G4P4   Menstrual History: Patient's last menstrual period was 11/04/2017 (approximate).    Past Medical History:  Diagnosis Date  . High risk HPV infection   . Hyperlipidemia   . Osteoarthritis   . Vitamin D deficiency     Past Surgical History:  Procedure Laterality Date  . ENDOMETRIAL ABLATION  12/08/2006   HER [facility]  . MOUTH SURGERY      Family History  Problem Relation Age of Onset  . Hypertension Mother   . Diabetes Father   . Breast cancer Neg Hx     Social History:  reports that she has never smoked. She has never used smokeless tobacco. She reports that she drinks alcohol. She reports that she does not use drugs.  Allergies: No Known Allergies  Medications Prior to Admission  Medication Sig Dispense Refill Last Dose  . atorvastatin (LIPITOR) 10 MG tablet Take 1 tablet (10 mg total) by mouth daily. 30 tablet 11 11/22/2017 at Unknown time  . cetirizine (ZYRTEC) 10 MG tablet Take 10 mg by mouth daily.   11/22/2017 at Unknown time  . cholecalciferol (VITAMIN D) 1000 units tablet Take 1,000 Units by mouth daily.   Past Month at Unknown time  . ferrous sulfate 325 (65 FE) MG tablet Take 325 mg by mouth daily with breakfast. Reported on 05/13/2015   11/22/2017 at Unknown time  . ibuprofen (ADVIL,MOTRIN) 200 MG tablet Take 800 mg by mouth every 6 (six) hours as  needed for mild pain.   Past Week at Unknown time  . ampicillin (PRINCIPEN) 500 MG capsule TAKE 1 CAPSULE TWO TIMES A DAY TIMES 5 DAYS (Patient not taking: Reported on 10/04/2017) 10 capsule 0 Not Taking at Unknown time  . Vitamin D, Ergocalciferol, (DRISDOL) 50000 units CAPS capsule Take 1 capsule (50,000 Units total) by mouth every 7 (seven) days. (Patient not taking: Reported on 11/23/2017) 12 capsule 0 Not Taking at Unknown time    REVIEW OF SYSTEMS: A ROS was performed and pertinent positives and negatives are included in the history.  GENERAL: No fevers or chills. HEENT: No change in vision, no earache, sore throat or sinus congestion. NECK: No pain or stiffness. CARDIOVASCULAR: No chest pain or pressure. No palpitations. PULMONARY: No shortness of breath, cough or wheeze. GASTROINTESTINAL: No abdominal pain, nausea, vomiting or diarrhea, melena or bright red blood per rectum. GENITOURINARY: No urinary frequency, urgency, hesitancy or dysuria. MUSCULOSKELETAL: No joint or muscle pain, no back pain, no recent trauma. DERMATOLOGIC: No rash, no itching, no lesions. ENDOCRINE: No polyuria, polydipsia, no heat or cold intolerance. No recent change in weight. HEMATOLOGICAL: No anemia or easy bruising or bleeding. NEUROLOGIC: No headache, seizures, numbness, tingling or weakness. PSYCHIATRIC: No depression, no loss of interest in normal activity or change in sleep pattern.   Blood pressure 139/82, pulse 87, temperature 98.8 F (37.1 C), temperature source Oral, resp. rate 16, weight 87.3 kg, last menstrual period 11/04/2017, SpO2 99 %.  Physical Exam:  See office notes  [facility] Hysterogram ( procedure  note)  The initial transvaginal ultrasound demonstrated the following: T/V and T/a images.  Anteverted uterus measuring 10.10 x 7.33 x 5.80 cm.  Submucosal fibroid measuring 1.6 x 1.7 cm.  Endometrial lining sick at 21.2 mm.  Right ovary with a thin irregular wall and internal echoes measuring  2.2 x 1.9 cm.  Negative color flow Doppler.  Left ovary with a thin walled cyst with septations measuring 2.5 x 2.6 cm.  Negative color flow Doppler.  No free fluid in the posterior cul-de-sac.  The speculum  was inserted and the cervix cleansed with Betadine solution after confirming that patient has no allergies.A small sonohysterography catheterwas utilized.  Insertion was facilitated with ring forceps, using a spear-like motion the catheter was inserted to the fundus of the uterus. The speculum is then removed carefully to avoid dislodging the catheter. The catheter was flushed with sterile saline delete prior to insertion to rid it of small amounts of air.the sterile saline solution was infused into the uterine cavity as a vaginal ultrasound probe was then placed in the vagina for full visualization of the uterine cavity from a transvaginal approach. The following was noted: Following injection of saline the endometrium is filled showing a right wall defect measuring 1.3 x 1.7 cm.  The endometrial lining excluding the fluid measures 14 mm.  The catheter was then removed after retrieving some of the saline from the intrauterine cavity. An endometrial biopsy was not done. Patient tolerated procedure well. She had received a tablet of Aleve for discomfort.    Assessment/Plan:  47 y.o. G4P4004   1. Menorrhagia with regular cycle History of hysteroscopy/endometrial polypectomy and endometrial ablation with NovaSure in 2015.  Given recurrence of heavy menstrual periods post ablation, patient declines repeating the same procedure.  Therefore, decision to proceed with LAVH/Bilateral Salpingectomy at patient's preference.  Surgery and risks reviewed thoroughly with patient voiced understanding and agreement.  2. Fibroids, submucosal Sonohysterography showing a right wall defect measuring 1.3 x 1.7 cm compatible with a submucosal fibroid.                        Patient was counseled as to the risk  of surgery to include the following:  1. Infection (prohylactic antibiotics will be administered)  2. DVT/Pulmonary Embolism (prophylactic pneumo compression stockings will be used)  3.Trauma to internal organs requiring additional surgical procedure to repair any injury to internal organs requiring perhaps additional hospitalization days.  4.Hemmorhage requiring transfusion and blood products which carry risks such as anaphylactic reaction, hepatitis and AIDS  Patient had received literature information on the procedure scheduled and all her questions were answered and fully accepts all risk.  Marie-Lyne Xiamara Hulet 11/28/2017, 5:45 AM

## 2017-11-29 ENCOUNTER — Encounter (HOSPITAL_BASED_OUTPATIENT_CLINIC_OR_DEPARTMENT_OTHER): Payer: Self-pay | Admitting: Obstetrics & Gynecology

## 2017-11-29 LAB — CBC
HEMATOCRIT: 25.5 % — AB (ref 36.0–46.0)
HEMOGLOBIN: 8.3 g/dL — AB (ref 12.0–15.0)
MCH: 22.4 pg — AB (ref 26.0–34.0)
MCHC: 32.5 g/dL (ref 30.0–36.0)
MCV: 68.7 fL — ABNORMAL LOW (ref 80.0–100.0)
Platelets: 269 10*3/uL (ref 150–400)
RBC: 3.71 MIL/uL — AB (ref 3.87–5.11)
RDW: 15.8 % — ABNORMAL HIGH (ref 11.5–15.5)
WBC: 12.2 10*3/uL — ABNORMAL HIGH (ref 4.0–10.5)
nRBC: 0 % (ref 0.0–0.2)

## 2017-11-29 MED ORDER — OXYCODONE-ACETAMINOPHEN 7.5-325 MG PO TABS: 1.0000 | ORAL_TABLET | Freq: Four times a day (QID) | ORAL | 0 refills | Status: DC | PRN

## 2017-11-29 MED ORDER — OXYCODONE-ACETAMINOPHEN 5-325 MG PO TABS
ORAL_TABLET | ORAL | Status: AC
  Filled 2017-11-29: qty 2

## 2017-11-29 NOTE — Progress Notes (Signed)
POD#1 LAVH/Bilateral Salpingectomy/Left Oophorectomy/Lysis of Adhesions Subjective: Patient reports tolerating PO and no problems voiding.    Objective: I have reviewed patient's vital signs.  vital signs, intake and output and medications.  Vitals:   11/28/17 2345 11/29/17 0500  BP: 118/76 (!) 105/58  Pulse: 87 80  Resp: 17 16  Temp: 98.3 F (36.8 C) 98.8 F (37.1 C)  SpO2: 98% 97%   I/O last 3 completed shifts: In: 6579 [P.O.:480; I.V.:1200; IV Piggyback:100] Out: 2650 [Urine:2525; Blood:125] No intake/output data recorded.  Results for orders placed or performed during the hospital encounter of 11/28/17 (from the past 24 hour(s))  CBC     Status: Abnormal   Collection Time: 11/29/17  5:23 AM  Result Value Ref Range   WBC 12.2 (H) 4.0 - 10.5 K/uL   RBC 3.71 (L) 3.87 - 5.11 MIL/uL   Hemoglobin 8.3 (L) 12.0 - 15.0 g/dL   HCT 25.5 (L) 36.0 - 46.0 %   MCV 68.7 (L) 80.0 - 100.0 fL   MCH 22.4 (L) 26.0 - 34.0 pg   MCHC 32.5 30.0 - 36.0 g/dL   RDW 15.8 (H) 11.5 - 15.5 %   Platelets 269 150 - 400 K/uL   nRBC 0.0 0.0 - 0.2 %    EXAM General: alert and cooperative Resp: clear to auscultation bilaterally Cardio: regular rate and rhythm GI: soft, non-tender; bowel sounds normal; no masses,  no organomegaly and incision: clean, dry and intact Extremities: Homans sign is negative, no sign of DVT Vaginal Bleeding: none  Assessment: s/p Procedure(s): LAPAROSCOPIC ASSISTED VAGINAL HYSTERECTOMY WITH SALPINGECTOMY AND LEFT OOPHERECTOMY: stable and progressing well  Plan: Discharge home  LOS: 0 days    [name], MD 11/29/2017 8:32 AM

## 2017-11-29 NOTE — Discharge Instructions (Signed)
Histerectoma total laparoscpica, cuidados posteriores (Total Laparoscopic Hysterectomy, Care After) Siga estas instrucciones durante las prximas semanas. Estas indicaciones le proporcionan informacin acerca de cmo deber cuidarse despus del procedimiento. El mdico tambin podr darle instrucciones ms especficas. El tratamiento se ha planificado de acuerdo a las prcticas mdicas actuales, pero a veces se producen problemas. Comunquese con el mdico si tiene algn problema o tiene dudas despus del procedimiento. QU ESPERAR DESPUS DEL PROCEDIMIENTO  Dolor y hematomas en los sitios de incisin. Le darn analgsicos para Financial controller.  Podr tener sntomas de menopausia, como calor repentino, sudoraciones nocturnas e insomnio si le han extirpado los ovarios.  Podr sentir dolor de garganta por el tubo del respirador que le colocaron durante la [place].  INSTRUCCIONES PARA EL CUIDADO EN EL HOGAR  Utilice los medicamentos de venta libre o recetados para Glass blower/designer, el malestar o la fiebre, segn se lo indique el mdico.  No tome aspirina. Puede ocasionar hemorragias.  No conduzca mientras est tomando analgsicos.  Siga las indicaciones de su mdico con respecto a la dieta, la actividad fsica, levantar objetos, conducir y para las actividades en general.  Reanude su dieta habitual, segn las indicaciones y los permisos.  Descanse y duerma lo suficiente.  Nose haga duchas vaginales, no utilice tampones ni tenga relaciones sexuales durante al menos 6 semanas o hasta que el profesional la autorice.  Cambie los apsitos (vendajes) tal como le indic su mdico.  Controle su temperatura y notifique a su mdico si tiene fiebre.  Tome duchas en lugar de baos durante 2 a 3 semanas.  No beba alcohol hasta que el mdico la autorice.  Si est estreida, tome un laxante suave, si el mdico la [place]. Los alimentos que contienen salvado la ayudarn para el problema de la  estreimiento. Debe ingerir la cantidad de lquidos suficientes para Theatre manager la orina de tono claro o color amarillo plido.  Trate de que alguien la acompae en su casa durante 1 o 2semanas, para ayudarla con los [employer].  Cumpla con todas las visitas de control, segn le indique su mdico.  SOLICITE ATENCIN MDICA SI:  Observa enrojecimiento, hinchazn o siente dolor cada vez ms intenso en los sitios de las incisiones.  Tiene pus en el sitio de la incisin.  Advierte un olor feo que proviene de la incisin.  La incisin se abre.  Se siente mareada o sufre un desmayo.  Siente dolor o tiene una hemorragia al [employer].  Tiene diarrea persistente.  Tiene nuseas o vmitos persistentes.  Tiene flujo vaginal anormal.  Tiene una erupcin cutnea.  Tiene alguna reaccin anormal o aparece una alergia por los medicamentos.  El dolor no se alivia con los medicamentos recetados.  SOLICITE ATENCIN MDICA DE INMEDIATO SI:  Siente falta de aire o dolor en el pecho.  Siente dolor intenso abdominal que no se alivia con los [employer].  Presenta dolor o hinchazn en las piernas.  ASEGRESE DE QUE:  Comprende estas instrucciones.  Controlar su afeccin.  Recibir ayuda de inmediato si no mejora o si empeora.  Esta informacin no tiene Marine scientist el consejo del mdico. Asegrese de hacerle al mdico cualquier pregunta que tenga. Document Released: 10/31/2012 Document Revised: 01/15/2013 Document Reviewed: 07/31/2012 Elsevier Interactive Patient Education  2017 [employer].

## 2017-12-04 NOTE — Discharge Summary (Signed)
Physician Discharge Summary  Patient ID: Gabrielle Cameron MRN: [id number] DOB/AGE: [birth year] 47 y.o.  Admit date: 11/28/2017 Discharge date: 12/04/2017  Admission Diagnoses: menorrhagia post endometrial ablation, fibroid   Discharge Diagnoses: Same Active Problems:   Post-operative state   Discharged Condition: good  Consults:None  Significant Diagnostic Studies: None  Treatments:surgery: Laparoscopic assisted vaginal hysterectomy with bilateral salpingectomy and left oophorectomy, lysis of adhesions  Vitals:   11/28/17 2345 11/29/17 0500  BP: 118/76 (!) 105/58  Pulse: 87 80  Resp: 17 16  Temp: 98.3 F (36.8 C) 98.8 F (37.1 C)  SpO2: 98% 97%     No intake/output data recorded.   Hospital Course: Good  Discharge Exam: Normal postop exam  Disposition: Discharge disposition: 01-Home or Self Care       Discharge Instructions    Discharge patient   Complete by:  As directed    Discharge disposition:  01-Home or Self Care   Discharge patient date:  11/29/2017       Allergies as of 11/29/2017   No Known Allergies     Medication List    STOP taking these medications   ampicillin 500 MG capsule Commonly known as:  PRINCIPEN     TAKE these medications   atorvastatin 10 MG tablet Commonly known as:  LIPITOR Take 1 tablet (10 mg total) by mouth daily.   cetirizine 10 MG tablet Commonly known as:  ZYRTEC Take 10 mg by mouth daily.   cholecalciferol 1000 units tablet Commonly known as:  VITAMIN D Take 1,000 Units by mouth daily.   ferrous sulfate 325 (65 FE) MG tablet Take 325 mg by mouth daily with breakfast. Reported on 05/13/2015   ibuprofen 200 MG tablet Commonly known as:  ADVIL,MOTRIN Take 800 mg by mouth every 6 (six) hours as needed for mild pain.   oxyCODONE-acetaminophen 7.5-325 MG tablet Commonly known as:  PERCOCET Take 1 tablet by mouth every 6 (six) hours as needed for severe pain.   Vitamin D (Ergocalciferol) 1.25 MG  (50000 UT) Caps capsule Commonly known as:  DRISDOL Take 1 capsule (50,000 Units total) by mouth every 7 (seven) days.        Follow-up Information    [name], MD Follow up in 3 week(s).   Specialty:  Obstetrics and Gynecology Contact information: [address] [place] [address] [phone]            Signed: [name] 12/04/2017, 11:04 AM

## 2017-12-06 ENCOUNTER — Other Ambulatory Visit: Payer: Self-pay | Admitting: Obstetrics & Gynecology

## 2017-12-06 ENCOUNTER — Telehealth: Payer: Self-pay | Admitting: *Deleted

## 2017-12-06 NOTE — Telephone Encounter (Signed)
Dr.Lavoie please the below.

## 2017-12-06 NOTE — Telephone Encounter (Signed)
-----   Message from Sinclair Grooms sent at 12/06/2017  3:06 PM EST ----- Regarding: nurse call Patient had surgery on November 5 and said that patch in her belly button has not fallen off; can she take it off or does it need to fall off on its own?

## 2017-12-07 NOTE — Telephone Encounter (Signed)
Claudia please see the below to relay to patient.

## 2017-12-07 NOTE — Telephone Encounter (Signed)
Patient informed. 

## 2017-12-07 NOTE — Telephone Encounter (Signed)
Remove the Belly Button dressing now.

## 2017-12-20 ENCOUNTER — Ambulatory Visit (INDEPENDENT_AMBULATORY_CARE_PROVIDER_SITE_OTHER): Payer: Self-pay | Admitting: Obstetrics & Gynecology

## 2017-12-20 ENCOUNTER — Encounter: Payer: Self-pay | Admitting: Obstetrics & Gynecology

## 2017-12-20 VITALS — BP 128/84

## 2017-12-20 DIAGNOSIS — Z09 Encounter for follow-up examination after completed treatment for conditions other than malignant neoplasm: Secondary | ICD-10-CM

## 2017-12-20 NOTE — Progress Notes (Signed)
    Gabrielle Cameron [facility] January 21, 1971 [id number]        46 y.o.  [id number]   RP: Postop LAVH/Bilateral Salpingectomy on 11/28/2017  HPI: Doing very well postop.  No abdominopelvic pain.  No vaginal bleeding.  Urine and bowel movements normal.  No fever.   OB History  Gravida Para Term Preterm AB Living  4 4 4     4   SAB TAB Ectopic Multiple Live Births          4    # Outcome Date GA Lbr Len/2nd Weight Sex Delivery Anes PTL Lv  4 Term     F Vag-Spont  N LIV  3 Term     F Vag-Spont  N LIV  2 Term     F Vag-Spont  N LIV  1 Term     F Vag-Spont  N LIV    Past medical history,surgical history, problem list, medications, allergies, family history and social history were all reviewed and documented in the EPIC chart.   Directed ROS with pertinent positives and negatives documented in the history of present illness/assessment and plan.  Exam:  Vitals:   12/20/17 1233  BP: 128/84   General appearance:  Normal  Abdomen: Soft, not distended.  Incisions healing well, closed and no erythema.  Gynecologic exam: Vulva normal.  Speculum: Vaginal vault healing well, closed.  No bleeding.  Normal secretions.  Patho: Uterus, cervix and bilateral fallopian tubes, left ovary - EXTENSIVE ADENOMYOSIS INVOLVING FULL THICKNESS OF UTERINE WALL. - BENIGN ENDOMETRIUM IN SECRETORY PHASE. - BENIGN UNREMARKABLE CERVIX. - BENIGN LEFT OVARY. - BENIGN UNREMARKABLE BILATERAL FALLOPIAN TUBES. - NO EVIDENCE OF MALIGNANCY.   Assessment/Plan:  47 y.o. G4P4004   1. Status post gynecological surgery, follow-up exam Good postop evolution.  Healing well.  No complication.  Surgical findings of endometriosis and pathology discussed with patient.  Can progress with physical activity.  No intercourse until follow-up in 4 weeks.  [name] MD, 12:51 PM 12/20/2017

## 2017-12-20 NOTE — Patient Instructions (Signed)
1. Status post gynecological surgery, follow-up exam Good postop evolution.  Healing well.  No complication.  Surgical findings of endometriosis and pathology discussed with patient.  Can progress with physical activity.  No intercourse until follow-up in 4 weeks.  Marijean Niemann un placer verle hoy!

## 2018-02-02 ENCOUNTER — Encounter: Payer: Self-pay | Admitting: Obstetrics & Gynecology

## 2018-02-02 ENCOUNTER — Ambulatory Visit (INDEPENDENT_AMBULATORY_CARE_PROVIDER_SITE_OTHER): Payer: Self-pay | Admitting: Obstetrics & Gynecology

## 2018-02-02 VITALS — BP 128/84

## 2018-02-02 DIAGNOSIS — Z09 Encounter for follow-up examination after completed treatment for conditions other than malignant neoplasm: Secondary | ICD-10-CM

## 2018-02-02 NOTE — Progress Notes (Signed)
    Gabrielle Cameron [facility] 1970/12/27 [id number]        48 y.o.  [id number] Married  RP: Post op LAVH/Bilateral Salpingectomy 11/28/2017  HPI: Excellent postop healing with no abdominal pelvic pain and no vaginal bleeding.  Normal vaginal secretions.  Good energy level.  No fever.   OB History  Gravida Para Term Preterm AB Living  4 4 4     4   SAB TAB Ectopic Multiple Live Births          4    # Outcome Date GA Lbr Len/2nd Weight Sex Delivery Anes PTL Lv  4 Term     F Vag-Spont  N LIV  3 Term     F Vag-Spont  N LIV  2 Term     F Vag-Spont  N LIV  1 Term     F Vag-Spont  N LIV    Past medical history,surgical history, problem list, medications, allergies, family history and social history were all reviewed and documented in the EPIC chart.   Directed ROS with pertinent positives and negatives documented in the history of present illness/assessment and plan.  Exam:  Vitals:   02/02/18 0847  BP: 128/84   General appearance:  Normal  Abdomen: Normal.  Incisions well closed and healed.  Gynecologic exam: Vulva normal.  Speculum: Vaginal vault well closed.  No erythema.  Normal vaginal secretions.  No blood.  Bimanual exam: Vaginal vault nontender.  No induration felt.  No pelvic mass felt, nontender.   Assessment/Plan:  48 y.o. G4P4004   1. Status post gynecological surgery, follow-up exam Excellent postop evolution and healing.  No complication.  Can resume sexual activity.  No contraindication to full physical activity.  Follow-up annual gynecologic exam February 2020.  [name] MD, 9:14 AM 02/02/2018

## 2018-02-02 NOTE — Patient Instructions (Signed)
1. Status post gynecological surgery, follow-up exam Excellent postop evolution and healing.  No complication.  Can resume sexual activity.  No contraindication to full physical activity.  Follow-up annual gynecologic exam February 2020.  Gabrielle Cameron un placer verle hoy!

## 2018-03-22 ENCOUNTER — Ambulatory Visit (INDEPENDENT_AMBULATORY_CARE_PROVIDER_SITE_OTHER): Payer: Self-pay | Admitting: Obstetrics & Gynecology

## 2018-03-22 ENCOUNTER — Encounter: Payer: Self-pay | Admitting: Obstetrics & Gynecology

## 2018-03-22 VITALS — BP 140/82 | Ht 64.0 in | Wt 193.0 lb

## 2018-03-22 DIAGNOSIS — Z01419 Encounter for gynecological examination (general) (routine) without abnormal findings: Secondary | ICD-10-CM

## 2018-03-22 DIAGNOSIS — B3731 Acute candidiasis of vulva and vagina: Secondary | ICD-10-CM

## 2018-03-22 DIAGNOSIS — Z6833 Body mass index (BMI) 33.0-33.9, adult: Secondary | ICD-10-CM

## 2018-03-22 DIAGNOSIS — Z90710 Acquired absence of both cervix and uterus: Secondary | ICD-10-CM

## 2018-03-22 DIAGNOSIS — E6609 Other obesity due to excess calories: Secondary | ICD-10-CM

## 2018-03-22 DIAGNOSIS — B373 Candidiasis of vulva and vagina: Secondary | ICD-10-CM

## 2018-03-22 MED ORDER — FLUCONAZOLE 150 MG PO TABS: 150.0000 mg | ORAL_TABLET | Freq: Once | ORAL | 2 refills | Status: AC

## 2018-03-22 NOTE — Progress Notes (Signed)
Gabrielle Cameron [facility] [birth year] [id number]   History:    48 y.o. G4P4L4  RP:  Established patient presenting for annual gyn exam   HPI: S/P LAVH/Bilateral Salpingectomy 11/28/2017.  Doing very well since surgery with no abdominal pelvic pains.  No pain with intercourse.  Complains of vaginal itching for which Monistat 3 helped, but symptoms not completely resolved now.  Urine and bowel movements normal.  Breast normal.  Body mass index 33.13.  Not exercising on a regular basis.  Fasting for health labs here today.  Past medical history,surgical history, family history and social history were all reviewed and documented in the EPIC chart.  Gynecologic History Patient's last menstrual period was 11/04/2017 (approximate). Contraception: status post hysterectomy Last Pap: 07/2015. Results were: Negative.  Patho of cervix benign from LAVH 11/2017 Last mammogram: 09/2017. Results were: Negative Bone Density: Never Colonoscopy: Never  Obstetric History OB History  Gravida Para Term Preterm AB Living  4 4 4     4   SAB TAB Ectopic Multiple Live Births          4    # Outcome Date GA Lbr Len/2nd Weight Sex Delivery Anes PTL Lv  4 Term     F Vag-Spont  N LIV  3 Term     F Vag-Spont  N LIV  2 Term     F Vag-Spont  N LIV  1 Term     F Vag-Spont  N LIV     ROS: A ROS was performed and pertinent positives and negatives are included in the history.  GENERAL: No fevers or chills. HEENT: No change in vision, no earache, sore throat or sinus congestion. NECK: No pain or stiffness. CARDIOVASCULAR: No chest pain or pressure. No palpitations. PULMONARY: No shortness of breath, cough or wheeze. GASTROINTESTINAL: No abdominal pain, nausea, vomiting or diarrhea, melena or bright red blood per rectum. GENITOURINARY: No urinary frequency, urgency, hesitancy or dysuria. MUSCULOSKELETAL: No joint or muscle pain, no back pain, no recent trauma. DERMATOLOGIC: No rash, no itching, no lesions. ENDOCRINE: No  polyuria, polydipsia, no heat or cold intolerance. No recent change in weight. HEMATOLOGICAL: No anemia or easy bruising or bleeding. NEUROLOGIC: No headache, seizures, numbness, tingling or weakness. PSYCHIATRIC: No depression, no loss of interest in normal activity or change in sleep pattern.     Exam:   BP 140/82 (BP Location: Right Arm, Patient Position: Sitting, Cuff Size: Large)   Ht 5' 4"  (1.626 m)   Wt 193 lb (87.5 kg)   LMP 11/04/2017 (Approximate)   BMI 33.13 kg/m   Body mass index is 33.13 kg/m.  General appearance : Well developed well nourished female. No acute distress HEENT: Eyes: no retinal hemorrhage or exudates,  Neck supple, trachea midline, no carotid bruits, no thyroidmegaly Lungs: Clear to auscultation, no rhonchi or wheezes, or rib retractions  Heart: Regular rate and rhythm, no murmurs or gallops Breast:Examined in sitting and supine position were symmetrical in appearance, no palpable masses or tenderness,  no skin retraction, no nipple inversion, no nipple discharge, no skin discoloration, no axillary or supraclavicular lymphadenopathy Abdomen: no palpable masses or tenderness, no rebound or guarding Extremities: no edema or skin discoloration or tenderness  Pelvic: Vulva: Normal             Vagina: No gross lesions or discharge  Cervix/Uterus absent  Adnexa  Without masses or tenderness  Anus: Normal   Assessment/Plan:  48 y.o. female for annual exam   1. Well female  exam with routine gynecological exam Gynecologic exam status post LAVH.  Pap test previously normal and pathology on cervix November 2019 was benign.  No indication to repeat the Pap test this year.  Breast exam normal.  Screening mammogram September 2019 was negative.  Fasting health labs here today. - CBC - Comp Met (CMET) - Lipid panel - TSH - VITAMIN D 25 Hydroxy (Vit-D Deficiency, Fractures)  2. S/P laparoscopic assisted vaginal hysterectomy (LAVH)  3. Yeast vaginitis We will  treat with fluconazole 150 mg tablet per mouth x1.  Usage reviewed and prescription sent to pharmacy.  4. Class 1 obesity due to excess calories without serious comorbidity with body mass index (BMI) of 33.0 to 33.9 in adult Recommend a lower calorie/carb diet such as [employer].  Increase physical activity with aerobic activities 5 times a week and weightlifting every 2 days.  Other orders - fluconazole (DIFLUCAN) 150 MG tablet; Take 1 tablet (150 mg total) by mouth once for 1 dose.  Counseling on above issues and coordination of care more than 50% for 10 minutes.  [name] MD, 8:50 AM 03/22/2018

## 2018-03-22 NOTE — Patient Instructions (Signed)
1. Well female exam with routine gynecological exam Gynecologic exam status post LAVH.  Pap test previously normal and pathology on cervix November 2019 was benign.  No indication to repeat the Pap test this year.  Breast exam normal.  Screening mammogram September 2019 was negative.  Fasting health labs here today. - CBC - Comp Met (CMET) - Lipid panel - TSH - VITAMIN D 25 Hydroxy (Vit-D Deficiency, Fractures)  2. S/P laparoscopic assisted vaginal hysterectomy (LAVH)  3. Yeast vaginitis We will treat with fluconazole 150 mg tablet per mouth x1.  Usage reviewed and prescription sent to pharmacy.  4. Class 1 obesity due to excess calories without serious comorbidity with body mass index (BMI) of 33.0 to 33.9 in adult Recommend a lower calorie/carb diet such as [employer].  Increase physical activity with aerobic activities 5 times a week and weightlifting every 2 days.  Other orders - fluconazole (DIFLUCAN) 150 MG tablet; Take 1 tablet (150 mg total) by mouth once for 1 dose.  Merrilyn Puma, fue un placer verle hoy!

## 2018-03-23 LAB — COMPREHENSIVE METABOLIC PANEL
AG RATIO: 2 (calc) (ref 1.0–2.5)
ALBUMIN MSPROF: 4.5 g/dL (ref 3.6–5.1)
ALKALINE PHOSPHATASE (APISO): 77 U/L (ref 31–125)
ALT: 15 U/L (ref 6–29)
AST: 15 U/L (ref 10–35)
BUN / CREAT RATIO: 10 (calc) (ref 6–22)
BUN: 6 mg/dL — ABNORMAL LOW (ref 7–25)
CHLORIDE: 101 mmol/L (ref 98–110)
CO2: 27 mmol/L (ref 20–32)
Calcium: 9.7 mg/dL (ref 8.6–10.2)
Creat: 0.58 mg/dL (ref 0.50–1.10)
GLOBULIN: 2.3 g/dL (ref 1.9–3.7)
GLUCOSE: 114 mg/dL — AB (ref 65–99)
POTASSIUM: 4.7 mmol/L (ref 3.5–5.3)
Sodium: 138 mmol/L (ref 135–146)
Total Bilirubin: 0.6 mg/dL (ref 0.2–1.2)
Total Protein: 6.8 g/dL (ref 6.1–8.1)

## 2018-03-23 LAB — CBC
HCT: 39.8 % (ref 35.0–45.0)
HEMOGLOBIN: 13.7 g/dL (ref 11.7–15.5)
MCH: 26.2 pg — ABNORMAL LOW (ref 27.0–33.0)
MCHC: 34.4 g/dL (ref 32.0–36.0)
MCV: 76.1 fL — ABNORMAL LOW (ref 80.0–100.0)
MPV: 11.1 fL (ref 7.5–12.5)
PLATELETS: 265 10*3/uL (ref 140–400)
RBC: 5.23 10*6/uL — AB (ref 3.80–5.10)
RDW: 15.8 % — ABNORMAL HIGH (ref 11.0–15.0)
WBC: 9.3 10*3/uL (ref 3.8–10.8)

## 2018-03-23 LAB — VITAMIN D 25 HYDROXY (VIT D DEFICIENCY, FRACTURES): Vit D, 25-Hydroxy: 18 ng/mL — ABNORMAL LOW (ref 30–100)

## 2018-03-23 LAB — LIPID PANEL
Cholesterol: 160 mg/dL (ref <200)
HDL: 35 mg/dL — ABNORMAL LOW (ref >=50)
NON-HDL CHOLESTEROL (CALC): 125 mg/dL (ref <130)
Total CHOL/HDL Ratio: 4.6 (calc) (ref <5.0)
Triglycerides: 438 mg/dL — ABNORMAL HIGH (ref <150)

## 2018-03-23 LAB — TSH: TSH: 2.55 mIU/L

## 2018-03-26 ENCOUNTER — Other Ambulatory Visit: Payer: Self-pay | Admitting: Anesthesiology

## 2018-03-26 MED ORDER — VITAMIN D (ERGOCALCIFEROL) 1.25 MG (50000 UNIT) PO CAPS: 50000.0000 [IU] | ORAL_CAPSULE | ORAL | 0 refills | Status: DC

## 2018-04-23 ENCOUNTER — Ambulatory Visit (INDEPENDENT_AMBULATORY_CARE_PROVIDER_SITE_OTHER): Payer: Self-pay | Admitting: Obstetrics & Gynecology

## 2018-04-23 ENCOUNTER — Other Ambulatory Visit: Payer: Self-pay

## 2018-04-23 ENCOUNTER — Encounter: Payer: Self-pay | Admitting: Obstetrics & Gynecology

## 2018-04-23 VITALS — BP 124/80 | Wt 184.0 lb

## 2018-04-23 DIAGNOSIS — R350 Frequency of micturition: Secondary | ICD-10-CM

## 2018-04-23 MED ORDER — SULFAMETHOXAZOLE-TRIMETHOPRIM 800-160 MG PO TABS: 1.0000 | ORAL_TABLET | Freq: Two times a day (BID) | ORAL | 0 refills | Status: AC

## 2018-04-23 NOTE — Progress Notes (Signed)
    Gabrielle Cameron [facility] April 13, 1970 [id number]        48 y.o.  [id number]   RP: Urinary frequency with blood in urine x 3 days  HPI:  S/P LAVH/Bilateral Salpingectomy 11/2017.  Took cranberry juice and increased hydration.  Still having frequency but less blood in urine seen this morning.  Mild suprapubic discomfort.  Normal vaginal secretions.  No fever.  Bowel movements normal.   OB History  Gravida Para Term Preterm AB Living  4 4 4     4   SAB TAB Ectopic Multiple Live Births          4    # Outcome Date GA Lbr Len/2nd Weight Sex Delivery Anes PTL Lv  4 Term     F Vag-Spont  N LIV  3 Term     F Vag-Spont  N LIV  2 Term     F Vag-Spont  N LIV  1 Term     F Vag-Spont  N LIV    Past medical history,surgical history, problem list, medications, allergies, family history and social history were all reviewed and documented in the EPIC chart.   Directed ROS with pertinent positives and negatives documented in the history of present illness/assessment and plan.  Exam:  Vitals:   04/23/18 1019  BP: 124/80  Weight: 184 lb (83.5 kg)   General appearance:  Normal  CVAT negative bilaterally  Abdomen: Normal  Gynecologic exam: Vulva normal.  Bimanual exam: Uterus/cervix absent.  No pelvic mass felt.  Nontender to exam.  U/A: Clear yellow, proteins and nitrites negative, white blood cells 0-5, red blood cells 3-10, few bacteria.  Urine culture pending.   Assessment/Plan:  48 y.o. G4P4004   1. Urinary frequency Probable acute cystitis per clinical symptoms and urine analysis.  No allergy to antibiotics.  Decision to treat with Bactrim DS 1 tablet twice a day for 3 days.  Usage risks and benefits reviewed with patient.  Will readjust per urine culture.  Prescription sent to pharmacy. - Urinalysis,Complete w/RFL Culture  Other orders - sulfamethoxazole-trimethoprim (BACTRIM DS,SEPTRA DS) 800-160 MG tablet; Take 1 tablet by mouth 2 (two) times daily for 3 days.  Counseling on  above issues and coordination of care more than 50% for 15 minutes.  [name] MD, 10:28 AM 04/23/2018

## 2018-04-25 LAB — URINALYSIS, COMPLETE W/RFL CULTURE
BILIRUBIN URINE: NEGATIVE
GLUCOSE, UA: NEGATIVE
HYALINE CAST: NONE SEEN /LPF
KETONES UR: NEGATIVE
LEUKOCYTE ESTERASE: NEGATIVE
NITRITES URINE, INITIAL: NEGATIVE
PH: 6 (ref 5.0–8.0)
Protein, ur: NEGATIVE
SPECIFIC GRAVITY, URINE: 1.02 (ref 1.001–1.03)

## 2018-04-25 LAB — URINE CULTURE
MICRO NUMBER:: 365303
SPECIMEN QUALITY: ADEQUATE

## 2018-04-25 LAB — CULTURE INDICATED

## 2018-04-26 ENCOUNTER — Encounter: Payer: Self-pay | Admitting: Obstetrics & Gynecology

## 2018-04-26 NOTE — Patient Instructions (Signed)
1. Urinary frequency Probable acute cystitis per clinical symptoms and urine analysis.  No allergy to antibiotics.  Decision to treat with Bactrim DS 1 tablet twice a day for 3 days.  Usage risks and benefits reviewed with patient.  Will readjust per urine culture.  Prescription sent to pharmacy. - Urinalysis,Complete w/RFL Culture  Other orders - sulfamethoxazole-trimethoprim (BACTRIM DS,SEPTRA DS) 800-160 MG tablet; Take 1 tablet by mouth 2 (two) times daily for 3 days.  Merrilyn Puma, fue un placer verle hoy!  Voy a informarle de sus Copy.

## 2018-08-27 ENCOUNTER — Other Ambulatory Visit: Payer: Self-pay

## 2018-08-27 DIAGNOSIS — Z20822 Contact with and (suspected) exposure to covid-19: Secondary | ICD-10-CM

## 2018-08-28 LAB — NOVEL CORONAVIRUS, NAA: SARS-CoV-2, NAA: NOT DETECTED

## 2018-10-08 ENCOUNTER — Other Ambulatory Visit: Payer: Self-pay | Admitting: Obstetrics & Gynecology

## 2018-10-08 DIAGNOSIS — Z1231 Encounter for screening mammogram for malignant neoplasm of breast: Secondary | ICD-10-CM

## 2018-10-26 ENCOUNTER — Ambulatory Visit
Admission: RE | Admit: 2018-10-26 | Discharge: 2018-10-26 | Disposition: A | Discharge status: Home / Self Care | Payer: No Typology Code available for payment source | Source: Ambulatory Visit | Attending: Obstetrics & Gynecology | Admitting: Obstetrics & Gynecology

## 2018-10-26 ENCOUNTER — Other Ambulatory Visit: Payer: Self-pay

## 2018-10-26 DIAGNOSIS — Z1231 Encounter for screening mammogram for malignant neoplasm of breast: Secondary | ICD-10-CM

## 2018-11-12 ENCOUNTER — Other Ambulatory Visit: Payer: Self-pay

## 2018-11-12 DIAGNOSIS — Z20822 Contact with and (suspected) exposure to covid-19: Secondary | ICD-10-CM

## 2018-11-13 LAB — NOVEL CORONAVIRUS, NAA: SARS-CoV-2, NAA: NOT DETECTED

## 2019-01-02 ENCOUNTER — Other Ambulatory Visit: Payer: Self-pay

## 2019-01-02 DIAGNOSIS — Z20822 Contact with and (suspected) exposure to covid-19: Secondary | ICD-10-CM

## 2019-01-03 LAB — NOVEL CORONAVIRUS, NAA: SARS-CoV-2, NAA: NOT DETECTED

## 2019-03-05 ENCOUNTER — Other Ambulatory Visit: Payer: Self-pay | Admitting: Obstetrics & Gynecology

## 2019-03-05 NOTE — Telephone Encounter (Signed)
CE is scheduled 05/03/2119.

## 2019-05-01 ENCOUNTER — Other Ambulatory Visit: Payer: Self-pay

## 2019-05-03 ENCOUNTER — Ambulatory Visit (INDEPENDENT_AMBULATORY_CARE_PROVIDER_SITE_OTHER): Payer: Self-pay | Admitting: Obstetrics & Gynecology

## 2019-05-03 ENCOUNTER — Other Ambulatory Visit: Payer: Self-pay

## 2019-05-03 ENCOUNTER — Encounter: Payer: Self-pay | Admitting: Obstetrics & Gynecology

## 2019-05-03 VITALS — BP 130/84 | Ht 63.5 in | Wt 193.0 lb

## 2019-05-03 DIAGNOSIS — Z01419 Encounter for gynecological examination (general) (routine) without abnormal findings: Secondary | ICD-10-CM

## 2019-05-03 DIAGNOSIS — Z90710 Acquired absence of both cervix and uterus: Secondary | ICD-10-CM

## 2019-05-03 DIAGNOSIS — E6609 Other obesity due to excess calories: Secondary | ICD-10-CM

## 2019-05-03 DIAGNOSIS — N898 Other specified noninflammatory disorders of vagina: Secondary | ICD-10-CM

## 2019-05-03 DIAGNOSIS — Z6833 Body mass index (BMI) 33.0-33.9, adult: Secondary | ICD-10-CM

## 2019-05-03 LAB — WET PREP FOR TRICH, YEAST, CLUE

## 2019-05-03 NOTE — Patient Instructions (Signed)
1. Well female exam with routine gynecological exam Gynecologic exam status post LAVH with left oophorectomy.  Pap reflex done on the vaginal vault.  Breast exam normal.  Screening mammogram October 2020 was negative.  Fasting health labs here today. - CBC - Comp Met (CMET) - TSH - Lipid panel - VITAMIN D 25 Hydroxy (Vit-D Deficiency, Fractures) - Pap IG w/ reflex to HPV when ASC-U  2. S/P laparoscopic assisted vaginal hysterectomy (LAVH)  3. Vaginal odor Wet prep negative.  Patient reassured. - WET PREP FOR TRICH, YEAST, CLUE  4. Class 1 obesity due to excess calories without serious comorbidity with body mass index (BMI) of 33.0 to 33.9 in adult Low calorie/carb diet such as [employer] recommended.  Aerobic activities 5 times a week and light weightlifting every 2 days.  Marijean Niemann un placer verle hoy!  Voy a informarle de sus [employer].

## 2019-05-03 NOTE — Progress Notes (Signed)
Gabrielle Cameron [facility] 12/21/1970 [id number]   History:    49 y.o. G4P4L4  Married.+   4 girls.  1 grand-daughter 71 yo.  RP:  Established patient presenting for annual gyn exam   HPI: S/P LAVH/Left Oophorectomy/Bilateral Salpingectomy 11/28/2017.  No pelvic pain.  No pain with intercourse, except for an occasional burning sensation.  Complains of vaginal odor.  Urine and bowel movements normal.  Breast normal.  Body mass index 33.65.  Not exercising on a regular basis.  Fasting for health labs here today.   Past medical history,surgical history, family history and social history were all reviewed and documented in the EPIC chart.  Gynecologic History Patient's last menstrual period was 11/04/2017 (approximate).  Obstetric History OB History  Gravida Para Term Preterm AB Living  4 4 4     4   SAB TAB Ectopic Multiple Live Births          4    # Outcome Date GA Lbr Len/2nd Weight Sex Delivery Anes PTL Lv  4 Term     F Vag-Spont  N LIV  3 Term     F Vag-Spont  N LIV  2 Term     F Vag-Spont  N LIV  1 Term     F Vag-Spont  N LIV     ROS: A ROS was performed and pertinent positives and negatives are included in the history.  GENERAL: No fevers or chills. HEENT: No change in vision, no earache, sore throat or sinus congestion. NECK: No pain or stiffness. CARDIOVASCULAR: No chest pain or pressure. No palpitations. PULMONARY: No shortness of breath, cough or wheeze. GASTROINTESTINAL: No abdominal pain, nausea, vomiting or diarrhea, melena or bright red blood per rectum. GENITOURINARY: No urinary frequency, urgency, hesitancy or dysuria. MUSCULOSKELETAL: No joint or muscle pain, no back pain, no recent trauma. DERMATOLOGIC: No rash, no itching, no lesions. ENDOCRINE: No polyuria, polydipsia, no heat or cold intolerance. No recent change in weight. HEMATOLOGICAL: No anemia or easy bruising or bleeding. NEUROLOGIC: No headache, seizures, numbness, tingling or weakness. PSYCHIATRIC: No  depression, no loss of interest in normal activity or change in sleep pattern.     Exam:   BP 130/84   Ht 5' 3.5" (1.613 m)   Wt 193 lb (87.5 kg)   LMP 11/04/2017 (Approximate)   BMI 33.65 kg/m   Body mass index is 33.65 kg/m.  General appearance : Well developed well nourished female. No acute distress HEENT: Eyes: no retinal hemorrhage or exudates,  Neck supple, trachea midline, no carotid bruits, no thyroidmegaly Lungs: Clear to auscultation, no rhonchi or wheezes, or rib retractions  Heart: Regular rate and rhythm, no murmurs or gallops Breast:Examined in sitting and supine position were symmetrical in appearance, no palpable masses or tenderness,  no skin retraction, no nipple inversion, no nipple discharge, no skin discoloration, no axillary or supraclavicular lymphadenopathy Abdomen: no palpable masses or tenderness, no rebound or guarding Extremities: no edema or skin discoloration or tenderness  Pelvic: Vulva: Normal             Vagina: No gross lesions or discharge.  Pap reflex.  Wet prep done.  Cervix/Uterus absent.  Adnexa  Without masses or tenderness  Anus: Normal  Wet prep: Negative   Assessment/Plan:  49 y.o. female for annual exam   1. Well female exam with routine gynecological exam Gynecologic exam status post LAVH with left oophorectomy.  Pap reflex done on the vaginal vault.  Breast exam normal.  Screening  mammogram October 2020 was negative.  Fasting health labs here today. - CBC - Comp Met (CMET) - TSH - Lipid panel - VITAMIN D 25 Hydroxy (Vit-D Deficiency, Fractures) - Pap IG w/ reflex to HPV when ASC-U  2. S/P laparoscopic assisted vaginal hysterectomy (LAVH)  3. Vaginal odor Wet prep negative.  Patient reassured. - WET PREP FOR TRICH, YEAST, CLUE  4. Class 1 obesity due to excess calories without serious comorbidity with body mass index (BMI) of 33.0 to 33.9 in adult Low calorie/carb diet such as [employer] recommended.  Aerobic  activities 5 times a week and light weightlifting every 2 days.  [name] MD, 8:16 AM 05/03/2019

## 2019-05-04 LAB — COMPREHENSIVE METABOLIC PANEL
AG Ratio: 1.7 (calc) (ref 1.0–2.5)
ALT: 19 U/L (ref 6–29)
AST: 19 U/L (ref 10–35)
Albumin: 4.4 g/dL (ref 3.6–5.1)
Alkaline phosphatase (APISO): 60 U/L (ref 31–125)
BUN: 9 mg/dL (ref 7–25)
CO2: 20 mmol/L (ref 20–32)
Calcium: 9.1 mg/dL (ref 8.6–10.2)
Chloride: 104 mmol/L (ref 98–110)
Creat: 0.65 mg/dL (ref 0.50–1.10)
Globulin: 2.6 g/dL (calc) (ref 1.9–3.7)
Glucose, Bld: 98 mg/dL (ref 65–99)
Potassium: 3.9 mmol/L (ref 3.5–5.3)
Sodium: 138 mmol/L (ref 135–146)
Total Bilirubin: 0.7 mg/dL (ref 0.2–1.2)
Total Protein: 7 g/dL (ref 6.1–8.1)

## 2019-05-04 LAB — CBC
HCT: 39.4 % (ref 35.0–45.0)
Hemoglobin: 13.4 g/dL (ref 11.7–15.5)
MCH: 27.5 pg (ref 27.0–33.0)
MCHC: 34 g/dL (ref 32.0–36.0)
MCV: 80.9 fL (ref 80.0–100.0)
MPV: 11.2 fL (ref 7.5–12.5)
Platelets: 261 10*3/uL (ref 140–400)
RBC: 4.87 10*6/uL (ref 3.80–5.10)
RDW: 14.5 % (ref 11.0–15.0)
WBC: 7.1 10*3/uL (ref 3.8–10.8)

## 2019-05-04 LAB — LIPID PANEL
Cholesterol: 182 mg/dL (ref <200)
HDL: 43 mg/dL — ABNORMAL LOW (ref >=50)
LDL Cholesterol (Calc): 109 mg/dL (calc) — ABNORMAL HIGH
Non-HDL Cholesterol (Calc): 139 mg/dL (calc) — ABNORMAL HIGH (ref <130)
Total CHOL/HDL Ratio: 4.2 (calc) (ref <5.0)
Triglycerides: 178 mg/dL — ABNORMAL HIGH (ref <150)

## 2019-05-04 LAB — VITAMIN D 25 HYDROXY (VIT D DEFICIENCY, FRACTURES): Vit D, 25-Hydroxy: 27 ng/mL — ABNORMAL LOW (ref 30–100)

## 2019-05-04 LAB — TSH: TSH: 3.21 mIU/L

## 2019-05-06 LAB — PAP IG W/ RFLX HPV ASCU

## 2019-05-26 ENCOUNTER — Other Ambulatory Visit: Payer: Self-pay | Admitting: Obstetrics & Gynecology

## 2019-05-27 NOTE — Telephone Encounter (Signed)
You last filled on 03/05/19 #30 with 1 refill. Her annual exam was on 05/03/19.

## 2019-05-31 NOTE — Telephone Encounter (Signed)
Last refill.  Patient needs to establish with a Fam MD and see both a Fam MD and myself every year.

## 2019-11-13 ENCOUNTER — Other Ambulatory Visit: Payer: Self-pay | Admitting: Obstetrics & Gynecology

## 2019-11-13 DIAGNOSIS — Z1231 Encounter for screening mammogram for malignant neoplasm of breast: Secondary | ICD-10-CM

## 2019-11-14 ENCOUNTER — Ambulatory Visit
Admission: RE | Admit: 2019-11-14 | Discharge: 2019-11-14 | Disposition: A | Discharge status: Home / Self Care | Payer: No Typology Code available for payment source | Source: Ambulatory Visit | Attending: Obstetrics & Gynecology | Admitting: Obstetrics & Gynecology

## 2019-11-14 ENCOUNTER — Other Ambulatory Visit: Payer: Self-pay

## 2019-11-14 DIAGNOSIS — Z1231 Encounter for screening mammogram for malignant neoplasm of breast: Secondary | ICD-10-CM

## 2020-05-05 ENCOUNTER — Other Ambulatory Visit: Payer: Self-pay

## 2020-05-05 ENCOUNTER — Encounter: Payer: Self-pay | Admitting: Obstetrics & Gynecology

## 2020-05-05 ENCOUNTER — Ambulatory Visit (INDEPENDENT_AMBULATORY_CARE_PROVIDER_SITE_OTHER): Payer: Self-pay | Admitting: Obstetrics & Gynecology

## 2020-05-05 VITALS — BP 130/82 | Ht 63.5 in | Wt 195.0 lb

## 2020-05-05 DIAGNOSIS — Z01419 Encounter for gynecological examination (general) (routine) without abnormal findings: Secondary | ICD-10-CM

## 2020-05-05 DIAGNOSIS — Z6834 Body mass index (BMI) 34.0-34.9, adult: Secondary | ICD-10-CM

## 2020-05-05 DIAGNOSIS — Z90710 Acquired absence of both cervix and uterus: Secondary | ICD-10-CM

## 2020-05-05 DIAGNOSIS — E6609 Other obesity due to excess calories: Secondary | ICD-10-CM

## 2020-05-05 NOTE — Progress Notes (Signed)
Gabrielle Cameron [facility] 30-Jan-1970 [id number]   History:    50 y.o.  G4P4L4  Married.+   4 girls.  1 grand-daughter 37 yo.  [id number] presenting for annual gyn exam   HPI:S/P LAVH/Left Oophorectomy/Bilateral Salpingectomy 11/28/2017.No pelvic pain. No pain with intercourse, except for an occasional burning sensation. Complains of vaginal odor.  Urine and bowel movements normal. Breast normal. Body mass index 34. Not exercising on a regular basis. Health labs with Fam MD.  Past medical history,surgical history, family history and social history were all reviewed and documented in the EPIC chart.  Gynecologic History Patient's last menstrual period was 11/04/2017 (approximate).  Obstetric History OB History  Gravida Para Term Preterm AB Living  4 4 4     4   SAB IAB Ectopic Multiple Live Births          4    # Outcome Date GA Lbr Len/2nd Weight Sex Delivery Anes PTL Lv  4 Term     F Vag-Spont  N LIV  3 Term     F Vag-Spont  N LIV  2 Term     F Vag-Spont  N LIV  1 Term     F Vag-Spont  N LIV     ROS: A ROS was performed and pertinent positives and negatives are included in the history.  GENERAL: No fevers or chills. HEENT: No change in vision, no earache, sore throat or sinus congestion. NECK: No pain or stiffness. CARDIOVASCULAR: No chest pain or pressure. No palpitations. PULMONARY: No shortness of breath, cough or wheeze. GASTROINTESTINAL: No abdominal pain, nausea, vomiting or diarrhea, melena or bright red blood per rectum. GENITOURINARY: No urinary frequency, urgency, hesitancy or dysuria. MUSCULOSKELETAL: No joint or muscle pain, no back pain, no recent trauma. DERMATOLOGIC: No rash, no itching, no lesions. ENDOCRINE: No polyuria, polydipsia, no heat or cold intolerance. No recent change in weight. HEMATOLOGICAL: No anemia or easy bruising or bleeding. NEUROLOGIC: No headache, seizures, numbness, tingling or weakness. PSYCHIATRIC: No depression, no  loss of interest in normal activity or change in sleep pattern.     Exam:   BP 130/82   Ht 5' 3.5" (1.613 m)   Wt 195 lb (88.5 kg)   LMP 11/04/2017 (Approximate)   BMI 34.00 kg/m   Body mass index is 34 kg/m.  General appearance : Well developed well nourished female. No acute distress HEENT: Eyes: no retinal hemorrhage or exudates,  Neck supple, trachea midline, no carotid bruits, no thyroidmegaly Lungs: Clear to auscultation, no rhonchi or wheezes, or rib retractions  Heart: Regular rate and rhythm, no murmurs or gallops Breast:Examined in sitting and supine position were symmetrical in appearance, no palpable masses or tenderness,  no skin retraction, no nipple inversion, no nipple discharge, no skin discoloration, no axillary or supraclavicular lymphadenopathy Abdomen: no palpable masses or tenderness, no rebound or guarding Extremities: no edema or skin discoloration or tenderness  Pelvic: Vulva: Normal             Vagina: No gross lesions or discharge  Cervix/Uterus absent  Adnexa  Without masses or tenderness  Anus: Normal   Assessment/Plan:  50 y.o. female for annual exam   1. Well female exam with routine gynecological exam Gynecologic exam status post LAVH/bilateral salpingectomy with left oophorectomy.  No indication for Pap test this year, Pap test negative in 2021.  Breast exam normal.  Screening mammogram October 2021 was negative.  Health labs with family physician.  2. S/P laparoscopic assisted vaginal hysterectomy (  LAVH)  3. Class 1 obesity due to excess calories without serious comorbidity with body mass index (BMI) of 34.0 to 34.9 in adult Recommend a lower calorie/carb diet.  Intermittent fasting reviewed.  Aerobic activities 5 times a week and light weightlifting every 2 days.  [name] MD, 8:21 AM 05/05/2020

## 2020-10-07 ENCOUNTER — Ambulatory Visit (INDEPENDENT_AMBULATORY_CARE_PROVIDER_SITE_OTHER): Payer: Self-pay | Admitting: Orthopaedic Surgery

## 2020-10-07 ENCOUNTER — Ambulatory Visit (INDEPENDENT_AMBULATORY_CARE_PROVIDER_SITE_OTHER): Payer: Self-pay

## 2020-10-07 ENCOUNTER — Encounter: Payer: Self-pay | Admitting: Orthopaedic Surgery

## 2020-10-07 DIAGNOSIS — G8929 Other chronic pain: Secondary | ICD-10-CM

## 2020-10-07 DIAGNOSIS — M25511 Pain in right shoulder: Secondary | ICD-10-CM

## 2020-10-07 NOTE — Progress Notes (Signed)
Office Visit Note   Patient: Gabrielle Cameron           Date of Birth: 11/13/1970           MRN: [id number] Visit Date: 10/07/2020              Requested by: Stana Bunting, PA-C [place],  [place] [address] PCP: Stana Bunting, PA-C   Assessment & Plan: Visit Diagnoses:  1. Chronic right shoulder pain     Plan: I did review the MRI which shows tendinopathy of the supraspinatus.  We tried our best to translate the MRI report which mainly states that she has some tearing of the supraspinatus.  Clinically she is very strong to rotator cuff testing and has minimal discomfort.  I recommend at this point that she continue taking NSAIDs and be referred to outpatient physical therapy.  I did suggest that if she does not feel any improvement we should obtain a new MR arthrogram to fully evaluate for structural abnormalities within the shoulder.  They are in agreement with this plan.  She will let me know if she is not improving in about 6 weeks.  Follow-Up Instructions: Return if symptoms worsen or fail to improve.   Orders:  Orders Placed This Encounter  Procedures   XR Shoulder Right   Ambulatory referral to Physical Therapy   No orders of the defined types were placed in this encounter.     Procedures: No procedures performed   Clinical Data: No additional findings.   Subjective: Chief Complaint  Patient presents with   Right Shoulder - Pain    Patient is a Hispanic female comes in with interpreter and her husband for pain in her right shoulder.  She originally injured it 3 months ago when she was making a hole in the ground and then recently had a reinjury when she was pulling her mom and felt a pop in her shoulder.  She then had an MRI of the shoulder that was done in [place] and she brought the MRI with her today along with the MRI report which is in [place].  She states that she has pain in the trapezius that radiates into the neck and down to the  arm.  She has normal range of motion.  Increased pain with raising her arm and lifting something more than about 5 to 10 pounds.  Denies any numbness and tingling.   Review of Systems  Constitutional: Negative.   HENT: Negative.    Eyes: Negative.   Respiratory: Negative.    Cardiovascular: Negative.   Endocrine: Negative.   Musculoskeletal: Negative.   Neurological: Negative.   Hematological: Negative.   Psychiatric/Behavioral: Negative.    All other systems reviewed and are negative.   Objective: Vital Signs: LMP 11/04/2017 (Approximate)   Physical Exam Vitals and nursing note reviewed.  Constitutional:      Appearance: She is well-developed.  Pulmonary:     Effort: Pulmonary effort is normal.  Skin:    General: Skin is warm.     Capillary Refill: Capillary refill takes less than 2 seconds.  Neurological:     Mental Status: She is alert and oriented to person, place, and time.  Psychiatric:        Behavior: Behavior normal.        Thought Content: Thought content normal.        Judgment: Judgment normal.    Ortho Exam  Right shoulder shows full range of motion.  Rotator cuff testing is normal with minimal discomfort.  There is no shrugging station.  Negative Hawkins.  Biceps is nontender.  Equivocal O'Brien.  Specialty Comments:  No specialty comments available.  Imaging: XR Shoulder Right  Result Date: 10/07/2020 Small calcification superior to the greater tuberosity.  Otherwise normal.    PMFS History: Patient Active Problem List   Diagnosis Date Noted   Post-operative state 11/28/2017   Mastodynia, female 08/31/2015   Abdominal pain, right upper quadrant 07/24/2014   Dysmenorrhea 07/08/2013   Mild cervical dysplasia 07/06/2011   Hypercholesterolemia 06/23/2011   Weight gain 06/23/2011   Family history of diabetes mellitus 06/23/2011   Past Medical History:  Diagnosis Date   High risk HPV infection    Hyperlipidemia    Osteoarthritis    Vitamin D  deficiency     Family History  Problem Relation Age of Onset   Hypertension Mother    Diabetes Father    Breast cancer Neg Hx     Past Surgical History:  Procedure Laterality Date   ENDOMETRIAL ABLATION  12/08/2006   HER OPTION   LAPAROSCOPIC VAGINAL HYSTERECTOMY WITH SALPINGECTOMY Bilateral 11/28/2017   Procedure: LAPAROSCOPIC ASSISTED VAGINAL HYSTERECTOMY WITH SALPINGECTOMY AND LEFT OOPHERECTOMY;  Surgeon: [name], MD;  Location: [facility];  Service: Gynecology;  Laterality: Bilateral;   MOUTH SURGERY     Social History   Occupational History   Not on file  Tobacco Use   Smoking status: Never   Smokeless tobacco: Never  Vaping Use   Vaping Use: Never used  Substance and Sexual Activity   Alcohol use: Yes    Alcohol/week: 0.0 standard drinks    Comment: very rarely   Drug use: No   Sexual activity: Yes    Partners: Male    Birth control/protection: None    Comment: PATIENT'S HUSBAND WITH VASECTOMY

## 2020-10-15 ENCOUNTER — Ambulatory Visit (INDEPENDENT_AMBULATORY_CARE_PROVIDER_SITE_OTHER): Payer: Self-pay | Admitting: Rehabilitative and Restorative Service Providers"

## 2020-10-15 ENCOUNTER — Other Ambulatory Visit: Payer: Self-pay

## 2020-10-15 ENCOUNTER — Encounter: Payer: Self-pay | Admitting: Rehabilitative and Restorative Service Providers"

## 2020-10-15 DIAGNOSIS — M25511 Pain in right shoulder: Secondary | ICD-10-CM

## 2020-10-15 DIAGNOSIS — M6281 Muscle weakness (generalized): Secondary | ICD-10-CM

## 2020-10-15 DIAGNOSIS — M25611 Stiffness of right shoulder, not elsewhere classified: Secondary | ICD-10-CM

## 2020-10-15 NOTE — Therapy (Signed)
[facility] Physical Therapy [address] [place], [place], [address] Phone: [phone]   Fax:  [phone]  Physical Therapy Evaluation  Patient Details  Name: Gabrielle Cameron MRN: [id number] Date of Birth: 01/19/1971 Referring Provider (PT): Leandrew Koyanagi MD   Encounter Date: 10/15/2020   PT End of Session - 10/15/20 1426     Visit Number 1    Number of Visits 8    Date for PT Re-Evaluation 12/10/20    PT Start Time 1100    PT Stop Time 1145    PT Time Calculation (min) 45 min    Activity Tolerance Patient tolerated treatment well    Behavior During Therapy [facility] for tasks assessed/performed             Past Medical History:  Diagnosis Date   High risk HPV infection    Hyperlipidemia    Osteoarthritis    Vitamin D deficiency     Past Surgical History:  Procedure Laterality Date   ENDOMETRIAL ABLATION  12/08/2006   HER OPTION   LAPAROSCOPIC VAGINAL HYSTERECTOMY WITH SALPINGECTOMY Bilateral 11/28/2017   Procedure: LAPAROSCOPIC ASSISTED VAGINAL HYSTERECTOMY WITH SALPINGECTOMY AND LEFT OOPHERECTOMY;  Surgeon: [name], MD;  Location: [facility];  Service: Gynecology;  Laterality: Bilateral;   MOUTH SURGERY      There were no vitals filed for this visit.    Subjective Assessment - 10/15/20 1423     Subjective Gabrielle Cameron was shoveling several weeks ago when she hit a rock and had sharp R shoulder pain.  Pain has improved some but is still very painful.    Pertinent History NA    Limitations Lifting    Patient Stated Goals Be able to use R shoulder without pain    Currently in Pain? Yes    Pain Score 5     Pain Location Shoulder    Pain Orientation Right    Pain Descriptors / Indicators Aching    Pain Type Acute pain    Pain Radiating Towards R lateral elbow    Pain Onset More than a month ago    Pain Frequency Constant    Aggravating Factors  Reaching, overhead function and lifting    Pain Relieving Factors  Rest, ice, tylenol    Effect of Pain on Daily Activities Limits all R UE function    Multiple Pain Sites No                [facility] PT Assessment - 10/15/20 0001       Assessment   Medical Diagnosis Chronic R shoulder pain    Referring Provider (PT) Georga Kaufmann Ephriam Jenkins MD    Onset Date/Surgical Date --   Several weeks ago while shoveling she hit a rock     Balance Screen   Has the patient fallen in the past 6 months No    Has the patient had a decrease in activity level because of a fear of falling?  No    Is the patient reluctant to leave their home because of a fear of falling?  No      Prior Function   Level of Independence Independent    Leisure Yard work and house chores      Cognition   Overall Cognitive Status Within Functional Limits for tasks assessed      Observation/Other Assessments   Focus on Therapeutic Outcomes (FOTO)  48 (Goal 80)      ROM / Strength   AROM / PROM /  Strength AROM;Strength      AROM   Overall AROM  Deficits    AROM Assessment Site Shoulder    Right/Left Shoulder Left;Right    Right Shoulder Flexion 170 Degrees    Right Shoulder Internal Rotation 70 Degrees    Right Shoulder External Rotation 90 Degrees    Right Shoulder Horizontal  ADduction 25 Degrees    Left Shoulder Flexion 170 Degrees    Left Shoulder Internal Rotation 70 Degrees    Left Shoulder External Rotation 85 Degrees    Left Shoulder Horizontal ADduction 35 Degrees      Strength   Overall Strength Deficits    Strength Assessment Site Shoulder    Right/Left Shoulder Left;Right    Right Shoulder Internal Rotation --   23.9 pounds   Right Shoulder External Rotation --   23.6 pounds   Left Shoulder Internal Rotation --   34.5 pounds   Left Shoulder External Rotation --   26.8 pounds                       Objective measurements completed on examination: See above findings.       [facility] Adult PT Treatment/Exercise - 10/15/20 0001       Exercises   Exercises  Shoulder      Shoulder Exercises: Supine   Protraction Strengthening;Right;20 reps;Limitations    Protraction Limitations 3 seconds      Shoulder Exercises: Seated   Retraction Strengthening    Retraction Limitations 10X 5 seconds SBP      Shoulder Exercises: Standing   External Rotation Strengthening;Right;10 reps;Theraband;Limitations    Theraband Level (Shoulder External Rotation) Level 3 (Green)    External Rotation Limitations 2 sets with 3 second hold and slow eccentrics    Internal Rotation Strengthening;Right;10 reps;Theraband;Limitations    Theraband Level (Shoulder Internal Rotation) Level 3 (Green)    Internal Rotation Limitations 3 seconds with slow eccentrics                     PT Education - 10/15/20 1425     Education Details Reviewed exam findings, brief run through of shoulder anatomy and starter HEP.    Person(s) Educated Patient    Methods Explanation;Demonstration;Tactile cues;Verbal cues;Handout    Comprehension Verbal cues required;Need further instruction;Returned demonstration;Verbalized understanding;Tactile cues required              PT Short Term Goals - 10/15/20 1534       PT SHORT TERM GOAL #1   Title Gabrielle Cameron will report subjective 50% improvement in her R shoulder symptoms.    Baseline Can be 7/10    Time 4    Period Weeks    Status New    Target Date 11/12/20      PT SHORT TERM GOAL #2   Title Gabrielle Cameron will be independent with her day 1 HEP.    Time 2    Period Weeks    Status New    Target Date 10/29/20               PT Long Term Goals - 10/15/20 1535       PT LONG TERM GOAL #1   Title Improve FOTO to 80.    Baseline 48    Time 8    Period Weeks    Status New    Target Date 12/10/20      PT LONG TERM GOAL #2   Title Gabrielle Cameron will report R shoulder  pain consistently 0-2/10 on the Numeric Pain Rating Scale.    Baseline Can be 5-7/10    Time 8    Period Weeks    Status New    Target Date 12/10/20      PT LONG TERM  GOAL #3   Title Improve R shoulder strength to 100% of the uninvolved L.    Baseline ~77%    Time 8    Period Weeks    Status New    Target Date 12/10/20      PT LONG TERM GOAL #4   Title Gabrielle Cameron will be independent and compliant with her long-term HEP at DC.    Time 8    Period Weeks    Status New    Target Date 12/10/20                    Plan - 10/15/20 1530     Clinical Impression Statement Gabrielle Cameron injured her R shoulder when she hit a rock with a shovel when doing some yard work several weeks ago.  Things have slightly improved but she would like to participate in PT to more quickly return to her normal activities.  Scapular and rotator cuff strengthening will be a major emphasis as AROM was good.  Some tightness noted and this will be addressed.  Her prognosis to meet LTGs is good with the recommended POC.    Examination-Activity Limitations Dressing;Sleep;Bed Mobility;Lift;Reach Overhead    Examination-Participation Restrictions [employer] Activity    Stability/Clinical Decision Making Stable/Uncomplicated    Clinical Decision Making Low    Rehab Potential Good    PT Frequency 1x / week    PT Duration 8 weeks    PT Treatment/Interventions ADLs/Self Care Home Management;Moist Heat;Cryotherapy;Therapeutic activities;Therapeutic exercise;Neuromuscular re-education;Patient/family education;Manual techniques;Passive range of motion    PT Next Visit Plan Progress scapular and RTC strength with additions to HEP PRN (cash pay).  Address mild capsular flexibility impairments as needed.    PT Home Exercise Plan Access Code: [facility]  URL: [email or web address]  Date: 10/15/2020  Prepared by: Vista Mink    Exercises  Standing Lumbar Extension at [place] 5 x daily - 7 x weekly - 1 sets - 5 reps - 3 seconds hold  Standing Scapular Retraction - 5 x daily - 7 x weekly - 1 sets - 5 reps - 5 second hold  Scapular Retraction with Resistance - 1 x daily - 7 x  weekly - 1 sets - 20 reps - 3 seconds hold  Standing Hip Hiking - 2 x daily - 7 x weekly - 2 sets - 10 reps - 3 seconds hold  Sit to Stand with Armchair - 2 x daily - 7 x weekly - 1 sets - 5 reps    Consulted and Agree with Plan of Care Patient             Patient will benefit from skilled therapeutic intervention in order to improve the following deficits and impairments:  Decreased endurance, Decreased range of motion, Decreased strength, Increased edema, Impaired UE functional use, Pain  Visit Diagnosis: Muscle weakness (generalized)  Stiffness of right shoulder, not elsewhere classified  Acute pain of right shoulder     Problem List Patient Active Problem List   Diagnosis Date Noted   Post-operative state 11/28/2017   Mastodynia, female 08/31/2015   Abdominal pain, right upper quadrant 07/24/2014   Dysmenorrhea 07/08/2013   Mild cervical dysplasia 07/06/2011   Hypercholesterolemia 06/23/2011  Weight gain 06/23/2011   Family history of diabetes mellitus 06/23/2011    Farley Ly, PT, MPT 10/15/2020, 3:39 PM  [facility] Physical Therapy [address] [place], [place], [address] Phone: [phone]   Fax:  [phone]  Name: Gabrielle Cameron MRN: [id number] Date of Birth: July 21, 1970

## 2020-10-15 NOTE — Patient Instructions (Signed)
Access Code: [email or web address] URL: [email or web address] Date: 10/15/2020 Prepared by: Vista Mink  Exercises Standing Scapular Retraction - 5 x daily - 7 x weekly - 1 sets - 5 reps - 5 second hold Supine Scapular Protraction in Flexion with Dumbbells - 2 x daily - 7 x weekly - 1 sets - 20 reps - 3 seconds hold Shoulder External Rotation with Anchored Resistance with Towel Under Elbow - 2 x daily - 3 x weekly - 1-2 sets - 10 reps - 3 hold Shoulder Internal Rotation with Resistance - 2 x daily - 7 x weekly - 1-2 sets - 10 reps

## 2020-10-19 ENCOUNTER — Other Ambulatory Visit: Payer: Self-pay

## 2020-10-19 ENCOUNTER — Ambulatory Visit: Payer: Self-pay | Admitting: Physical Therapy

## 2020-10-19 ENCOUNTER — Encounter: Payer: Self-pay | Admitting: Physical Therapy

## 2020-10-19 DIAGNOSIS — M25611 Stiffness of right shoulder, not elsewhere classified: Secondary | ICD-10-CM

## 2020-10-19 DIAGNOSIS — M25511 Pain in right shoulder: Secondary | ICD-10-CM

## 2020-10-19 DIAGNOSIS — M6281 Muscle weakness (generalized): Secondary | ICD-10-CM

## 2020-10-19 NOTE — Therapy (Signed)
[facility] Physical Therapy [address] [place], [place], [address] Phone: [phone]   Fax:  [phone]  Physical Therapy Treatment  Patient Details  Name: Gabrielle Cameron MRN: [id number] Date of Birth: 13-Dec-1970 Referring Provider (PT): Leandrew Koyanagi MD   Encounter Date: 10/19/2020   PT End of Session - 10/19/20 0842     Visit Number 2    Number of Visits 8    Date for PT Re-Evaluation 12/10/20    PT Start Time 0842    PT Stop Time 0930    PT Time Calculation (min) 48 min    Activity Tolerance Patient tolerated treatment well    Behavior During Therapy [facility] for tasks assessed/performed             Past Medical History:  Diagnosis Date   High risk HPV infection    Hyperlipidemia    Osteoarthritis    Vitamin D deficiency     Past Surgical History:  Procedure Laterality Date   ENDOMETRIAL ABLATION  12/08/2006   HER OPTION   LAPAROSCOPIC VAGINAL HYSTERECTOMY WITH SALPINGECTOMY Bilateral 11/28/2017   Procedure: LAPAROSCOPIC ASSISTED VAGINAL HYSTERECTOMY WITH SALPINGECTOMY AND LEFT OOPHERECTOMY;  Surgeon: [name], MD;  Location: [place];  Service: Gynecology;  Laterality: Bilateral;   MOUTH SURGERY      There were no vitals filed for this visit.   Subjective Assessment - 10/19/20 0843     Subjective She has been doing her exercises. Shoulder feels better but theraband makes it hurt.    Pertinent History NA    Limitations Lifting    Patient Stated Goals Be able to use R shoulder without pain    Currently in Pain? Yes    Pain Score 3    since last PT appt, lowest 1/10, highest 7/10   Pain Location Shoulder    Pain Orientation Right;Anterior    Pain Descriptors / Indicators Burning    Pain Type Acute pain    Pain Onset More than a month ago    Pain Frequency Intermittent    Aggravating Factors  theraband exercises, overhead activities    Pain Relieving Factors medication                                [facility] Adult PT Treatment/Exercise - 10/19/20 0841       Self-Care   Self-Care ADL's    ADL's Pt reports that she is side sleeper which aggravates her shoulder.  PT demo & verbal cues on using pillows to position.  Pt return demo & verbalized understanding. She reported less pain in shoulder with this positioning.      Neuro Re-ed    Neuro Re-ed Details  PT demo & verbal cues on head & shoulder posture in relation to shoulder function with lower risk of injury.  Pt verbalized understanding but required tactile cues with activities      Exercises   Exercises Shoulder      Shoulder Exercises: Supine   Protraction Strengthening;Right;20 reps;Limitations    Protraction Limitations 3 seconds    Flexion AAROM;Both;15 reps;Weights    Shoulder Flexion Weight (lbs) 2# bar    ABduction AAROM;Right;10 reps    Shoulder ABduction Weight (lbs) 2# bar    Other Supine Exercises stabilization holding 2# wt with RUE straight flexed to 90* - 63min with light alternating resistance flex/ext & 1 min abd/add      Shoulder Exercises: Seated  Retraction Strengthening    Retraction Limitations 10X 5 seconds SBP      Shoulder Exercises: Prone   Flexion Strengthening;Right;10 reps   2 sets   Flexion Limitations scaption    Extension AROM;Strengthening;Right;12 reps   2 sets   Extension Limitations extension to neutral with trunk then IR to touch buttocks      Shoulder Exercises: Sidelying   External Rotation Strengthening;Right;10 reps;Weights    External Rotation Weight (lbs) 2      Shoulder Exercises: Standing   External Rotation Strengthening;Right;10 reps;Theraband;Limitations    Theraband Level (Shoulder External Rotation) Level 3 (Green)    External Rotation Limitations 2 sets with 3 second hold and slow eccentrics    Internal Rotation Strengthening;Right;10 reps;Theraband;Limitations    Theraband Level (Shoulder Internal Rotation) Level 3 (Green)     Internal Rotation Limitations 3 seconds with slow eccentrics    Flexion Strengthening;Right;10 reps;Weights    Shoulder Flexion Weight (lbs) 2    Flexion Limitations placing wt in upper cabinet just above head ht    Other Standing Exercises posture - cervical retraction closed chain against door 5" hold 10 reps and 10 reps with scapular sets.    Other Standing Exercises modified push up off counter top with PT demo, tactile & verbal cues      Shoulder Exercises: ROM/Strengthening   UBE (Upper Arm Bike) level 1 seat 9 53min forward & 3 min back, verbal & tactile cues for posture                       PT Short Term Goals - 10/15/20 1534       PT SHORT TERM GOAL #1   Title Adalene will report subjective 50% improvement in her R shoulder symptoms.    Baseline Can be 7/10    Time 4    Period Weeks    Status New    Target Date 11/12/20      PT SHORT TERM GOAL #2   Title Zelphia will be independent with her day 1 HEP.    Time 2    Period Weeks    Status New    Target Date 10/29/20               PT Long Term Goals - 10/15/20 1535       PT LONG TERM GOAL #1   Title Improve FOTO to 80.    Baseline 48    Time 8    Period Weeks    Status New    Target Date 12/10/20      PT LONG TERM GOAL #2   Title Kerrington will report R shoulder pain consistently 0-2/10 on the Numeric Pain Rating Scale.    Baseline Can be 5-7/10    Time 8    Period Weeks    Status New    Target Date 12/10/20      PT LONG TERM GOAL #3   Title Improve R shoulder strength to 100% of the uninvolved L.    Baseline ~77%    Time 8    Period Weeks    Status New    Target Date 12/10/20      PT LONG TERM GOAL #4   Title Jaelah will be independent and compliant with her long-term HEP at DC.    Time 8    Period Weeks    Status New    Target Date 12/10/20  Plan - 10/19/20 0842     Clinical Impression Statement PT cued patient on proper cervical & shoulder posture incorporating  into exercises & dominant RUE function.  Pt improved with instruction with less pain during movements.    Examination-Activity Limitations Dressing;Sleep;Bed Mobility;Lift;Reach Overhead    Examination-Participation Restrictions Yard Work;Community Activity    Stability/Clinical Decision Making Stable/Uncomplicated    Rehab Potential Good    PT Frequency 1x / week    PT Duration 8 weeks    PT Treatment/Interventions ADLs/Self Care Home Management;Moist Heat;Cryotherapy;Therapeutic activities;Therapeutic exercise;Neuromuscular re-education;Patient/family education;Manual techniques;Passive range of motion    PT Next Visit Plan update HEP to include progressive exercises,  self-pay fo 1x/wk by pt request.  progress to closed chain & function movement patterns.    PT Home Exercise Plan Access Code: [email or web address]  URL: [email or web address]  Date: 10/15/2020  Prepared by: Vista Mink     Exercises  Standing Scapular Retraction - 5 x daily - 7 x weekly - 1 sets - 5 reps - 5 second hold  Supine Scapular Protraction in Flexion with Dumbbells - 2 x daily - 7 x weekly - 1 sets - 20 reps - 3 seconds hold  Shoulder External Rotation with Anchored Resistance with Towel Under Elbow - 2 x daily - 3 x weekly - 1-2 sets - 10 reps - 3 hold  Shoulder Internal Rotation with Resistance - 2 x daily - 7 x weekly - 1-2 sets - 10 reps    Consulted and Agree with Plan of Care Patient             Patient will benefit from skilled therapeutic intervention in order to improve the following deficits and impairments:  Decreased endurance, Decreased range of motion, Decreased strength, Increased edema, Impaired UE functional use, Pain  Visit Diagnosis: Muscle weakness (generalized)  Stiffness of right shoulder, not elsewhere classified  Acute pain of right shoulder     Problem List Patient Active Problem List   Diagnosis Date Noted   Post-operative state 11/28/2017   Mastodynia, female 08/31/2015    Abdominal pain, right upper quadrant 07/24/2014   Dysmenorrhea 07/08/2013   Mild cervical dysplasia 07/06/2011   Hypercholesterolemia 06/23/2011   Weight gain 06/23/2011   Family history of diabetes mellitus 06/23/2011    Jamey Reas, PT, DPT 10/19/2020, 10:45 AM  [facility] Physical Therapy [address] [place], [place], [address] Phone: [phone]   Fax:  [phone]  Name: Gabrielle Cameron MRN: [id number] Date of Birth: 1970/08/08

## 2020-10-30 ENCOUNTER — Encounter: Payer: Self-pay | Admitting: Rehabilitative and Restorative Service Providers"

## 2020-10-30 ENCOUNTER — Ambulatory Visit: Payer: Self-pay | Admitting: Rehabilitative and Restorative Service Providers"

## 2020-10-30 ENCOUNTER — Other Ambulatory Visit: Payer: Self-pay

## 2020-10-30 DIAGNOSIS — M25611 Stiffness of right shoulder, not elsewhere classified: Secondary | ICD-10-CM

## 2020-10-30 DIAGNOSIS — M25511 Pain in right shoulder: Secondary | ICD-10-CM

## 2020-10-30 DIAGNOSIS — M6281 Muscle weakness (generalized): Secondary | ICD-10-CM

## 2020-10-30 NOTE — Therapy (Signed)
[facility] Physical Therapy [address] [place], [place], [address] Phone: [phone]   Fax:  [phone]  Physical Therapy Treatment  Patient Details  Name: Gabrielle Cameron MRN: [id number] Date of Birth: 10/11/1970 Referring Provider (PT): Leandrew Koyanagi MD   Encounter Date: 10/30/2020   PT End of Session - 10/30/20 1053     Visit Number 3    Number of Visits 8    Date for PT Re-Evaluation 12/10/20    PT Start Time 1057    PT Stop Time 1136    PT Time Calculation (min) 39 min    Activity Tolerance Patient tolerated treatment well    Behavior During Therapy [facility] for tasks assessed/performed             Past Medical History:  Diagnosis Date   High risk HPV infection    Hyperlipidemia    Osteoarthritis    Vitamin D deficiency     Past Surgical History:  Procedure Laterality Date   ENDOMETRIAL ABLATION  12/08/2006   HER OPTION   LAPAROSCOPIC VAGINAL HYSTERECTOMY WITH SALPINGECTOMY Bilateral 11/28/2017   Procedure: LAPAROSCOPIC ASSISTED VAGINAL HYSTERECTOMY WITH SALPINGECTOMY AND LEFT OOPHERECTOMY;  Surgeon: [name], MD;  Location: [place];  Service: Gynecology;  Laterality: Bilateral;   MOUTH SURGERY      There were no vitals filed for this visit.   Subjective Assessment - 10/30/20 1104     Subjective Pt. indicated she continued to have pain in shoulder c reaching and also pain noted in band based exercises.  Pt. indicated pain today at 9/10 c movements.    Pertinent History NA    Limitations Lifting    Patient Stated Goals Be able to use R shoulder without pain    Pain Location Shoulder    Pain Orientation Right    Pain Type Acute pain    Pain Onset More than a month ago    Pain Frequency Intermittent    Aggravating Factors  overhead reaching, lifting, band exercises    Pain Relieving Factors not doing movements that are painful.                [facility] PT Assessment - 10/30/20 0001        Assessment   Medical Diagnosis Chronic Rt shoulder pain    Referring Provider (PT) Georga Kaufmann Ephriam Jenkins MD                           [facility] Adult PT Treatment/Exercise - 10/30/20 0001       Shoulder Exercises: Sidelying   External Rotation Other (comment)   performed in manual for myofascial release     Shoulder Exercises: Standing   External Rotation Strengthening   3 x 10 c towel at side   Theraband Level (Shoulder External Rotation) Level 3 (Green)    Internal Rotation 10 reps;Right   c towel at side   Theraband Level (Shoulder Internal Rotation) Level 3 (Green)    Row 20 reps;Both    Theraband Level (Shoulder Row) Level 3 (Green)    Other Standing Exercises standing shoulder flexion 90 deg to abduction to side and reverse superset 1 lb 2 x 10    Other Standing Exercises wall push up c serratus press 2 sec hold x 20      Shoulder Exercises: ROM/Strengthening   UBE (Upper Arm Bike) Lvl 2 3 mins fwd/back each way      Manual Therapy  Manual therapy comments compression c active movement ER in Lt sidelying for trigger point release                     PT Education - 10/30/20 1132     Education Details HEP progression.  Trigger point referral pain pattern    Person(s) Educated Patient    Methods Explanation;Demonstration;Verbal cues;Handout    Comprehension Returned demonstration;Verbalized understanding              PT Short Term Goals - 10/30/20 1133       PT SHORT TERM GOAL #1   Title Doninique will report subjective 50% improvement in her R shoulder symptoms.    Baseline Can be 7/10    Time 4    Period Weeks    Status On-going    Target Date 11/12/20      PT SHORT TERM GOAL #2   Title Dyneshia will be independent with her day 1 HEP.    Time 2    Period Weeks    Status On-going    Target Date 10/29/20               PT Long Term Goals - 10/15/20 1535       PT LONG TERM GOAL #1   Title Improve FOTO to 80.    Baseline 48    Time 8     Period Weeks    Status New    Target Date 12/10/20      PT LONG TERM GOAL #2   Title Anavey will report R shoulder pain consistently 0-2/10 on the Numeric Pain Rating Scale.    Baseline Can be 5-7/10    Time 8    Period Weeks    Status New    Target Date 12/10/20      PT LONG TERM GOAL #3   Title Improve R shoulder strength to 100% of the uninvolved L.    Baseline ~77%    Time 8    Period Weeks    Status New    Target Date 12/10/20      PT LONG TERM GOAL #4   Title Shania will be independent and compliant with her long-term HEP at DC.    Time 8    Period Weeks    Status New    Target Date 12/10/20                   Plan - 10/30/20 1120     Clinical Impression Statement Very positive improvement from trigger point release techniques to Rt infraspinatus.  Pt. arrived c pain in elevation and MMT for flexion and ER but after manual intervention, she reported pain was gone and previously painful HEP c band was painfree today as well after manual.  Continued skilled PT services indicated to continue to reduce pain in Rt shoulder.    Examination-Activity Limitations Dressing;Sleep;Bed Mobility;Lift;Reach Overhead    Examination-Participation Restrictions Yard Work;Community Activity    Stability/Clinical Decision Making Stable/Uncomplicated    Rehab Potential Good    PT Frequency 1x / week    PT Duration 8 weeks    PT Treatment/Interventions ADLs/Self Care Home Management;Moist Heat;Cryotherapy;Therapeutic activities;Therapeutic exercise;Neuromuscular re-education;Patient/family education;Manual techniques;Passive range of motion    PT Next Visit Plan Trigger point release, possible dry needling to Rt infraspinatus if desired.    PT Home Exercise Plan [email or web address]    Consulted and Agree with Plan of Care Patient  Patient will benefit from skilled therapeutic intervention in order to improve the following deficits and impairments:  Decreased endurance, Decreased  range of motion, Decreased strength, Increased edema, Impaired UE functional use, Pain  Visit Diagnosis: Muscle weakness (generalized)  Stiffness of right shoulder, not elsewhere classified  Acute pain of right shoulder     Problem List Patient Active Problem List   Diagnosis Date Noted   Post-operative state 11/28/2017   Mastodynia, female 08/31/2015   Abdominal pain, right upper quadrant 07/24/2014   Dysmenorrhea 07/08/2013   Mild cervical dysplasia 07/06/2011   Hypercholesterolemia 06/23/2011   Weight gain 06/23/2011   Family history of diabetes mellitus 06/23/2011   Scot Jun, PT, DPT, OCS, ATC 10/30/20  11:48 AM    [facility] Physical Therapy [address] [place], [place], [address] Phone: [phone]   Fax:  [phone]  Name: Modestine Scherzinger MRN: [id number] Date of Birth: [birth year]

## 2020-10-30 NOTE — Patient Instructions (Signed)
Access Code: [email or web address] URL: [email or web address] Date: 10/30/2020 Prepared by: Scot Jun  Exercises Standing Scapular Retraction - 5 x daily - 7 x weekly - 1 sets - 5 reps - 5 second hold Supine Scapular Protraction in Flexion with Dumbbells - 2 x daily - 7 x weekly - 1 sets - 20 reps - 3 seconds hold Shoulder External Rotation with Anchored Resistance with Towel Under Elbow - 2 x daily - 3 x weekly - 1-2 sets - 10 reps - 3 hold Shoulder Internal Rotation with Resistance - 2 x daily - 7 x weekly - 1-2 sets - 10 reps Standing Shoulder Posterior Capsule Stretch (Mirrored) - 2 x daily - 7 x weekly - 1 sets - 5 reps - 15-30 hold Standing flexion to 90 degrees to abduction to side and return 2 x 10 0-1 lb weight

## 2020-11-05 ENCOUNTER — Ambulatory Visit: Payer: Self-pay | Admitting: Rehabilitative and Restorative Service Providers"

## 2020-11-05 ENCOUNTER — Encounter: Payer: Self-pay | Admitting: Rehabilitative and Restorative Service Providers"

## 2020-11-05 ENCOUNTER — Other Ambulatory Visit: Payer: Self-pay

## 2020-11-05 DIAGNOSIS — M25511 Pain in right shoulder: Secondary | ICD-10-CM

## 2020-11-05 DIAGNOSIS — M6281 Muscle weakness (generalized): Secondary | ICD-10-CM

## 2020-11-05 DIAGNOSIS — M25611 Stiffness of right shoulder, not elsewhere classified: Secondary | ICD-10-CM

## 2020-11-05 NOTE — Therapy (Signed)
[facility] [place], [place], [address] Phone: [phone]   Fax:  [phone]  Physical Therapy Treatment  Patient Details  Name: Gabrielle Cameron MRN: [id number] Date of Birth: 06-Mar-1970 Referring Provider (PT): Leandrew Koyanagi MD   Encounter Date: 11/05/2020   PT End of Session - 11/05/20 1140     Visit Number 4    Number of Visits 8    Date for PT Re-Evaluation 12/10/20    PT Start Time 1100    PT Stop Time 1138    PT Time Calculation (min) 38 min    Activity Tolerance Patient tolerated treatment well;No increased pain    Behavior During Therapy [facility] for tasks assessed/performed             Past Medical History:  Diagnosis Date   High risk HPV infection    Hyperlipidemia    Osteoarthritis    Vitamin D deficiency     Past Surgical History:  Procedure Laterality Date   ENDOMETRIAL ABLATION  12/08/2006   HER OPTION   LAPAROSCOPIC VAGINAL HYSTERECTOMY WITH SALPINGECTOMY Bilateral 11/28/2017   Procedure: LAPAROSCOPIC ASSISTED VAGINAL HYSTERECTOMY WITH SALPINGECTOMY AND LEFT OOPHERECTOMY;  Surgeon: [name], MD;  Location: [place];  Service: Gynecology;  Laterality: Bilateral;   MOUTH SURGERY      There were no vitals filed for this visit.   Subjective Assessment - 11/05/20 1129     Subjective Driving and making tortilas are challenging.  Cameron has improved.    Patient is accompained by: Interpreter   interpreter present but Pt. communicated directly in [place] and signed paper to wave service going forward.   Pertinent History NA    Limitations Lifting    Patient Stated Goals Be able to use R shoulder without pain    Currently in Pain? No/denies    Pain Score 0-No pain    Pain Location Shoulder    Pain Orientation Right    Pain Descriptors / Indicators Sore    Pain Type Acute pain    Pain Radiating Towards Upper trapezius and chest (was R lateral elbow)    Pain Onset  More than a month ago    Pain Frequency Intermittent    Aggravating Factors  Driving and making tortillas    Pain Relieving Factors Being careful with R shoulder use    Effect of Pain on Daily Activities Avoiding heavier house chores due to R shoulder pain                [facility] PT Assessment - 11/05/20 0001       ROM / Strength   AROM / PROM / Strength AROM      AROM   Overall AROM  Deficits    AROM Assessment Site Shoulder    Right/Left Shoulder Right    Right Shoulder Flexion 180 Degrees    Right Shoulder Internal Rotation 75 Degrees    Right Shoulder External Rotation 90 Degrees    Right Shoulder Horizontal  ADduction 35 Degrees                           [facility] Adult PT Treatment/Exercise - 11/05/20 0001       Exercises   Exercises Shoulder      Shoulder Exercises: Supine   Protraction Strengthening;Right;20 reps;Limitations    Protraction Weight (lbs) 5    Protraction Limitations 3 seconds      Shoulder Exercises: Seated  Retraction Strengthening    Retraction Limitations 10X 5 seconds SBP      Shoulder Exercises: Standing   External Rotation Strengthening;Right;10 reps;Theraband;Limitations    Theraband Level (Shoulder External Rotation) Level 3 (Green)    External Rotation Limitations 2 sets with 3 second hold and slow eccentrics    Internal Rotation Strengthening;Right;10 reps;Theraband;Limitations    Theraband Level (Shoulder Internal Rotation) Level 3 (Green)    Internal Rotation Limitations 3 seconds with slow eccentrics    Row 20 reps;Both    Theraband Level (Shoulder Row) Level 4 (Blue)                     PT Education - 11/05/20 1140     Education Details Reviewed HEP with addition of posterior capsule stretch.    Person(s) Educated Patient    Methods Explanation;Demonstration;Tactile cues;Verbal cues;Handout    Comprehension Verbalized understanding;Returned demonstration;Need further instruction;Verbal cues  required;Tactile cues required              PT Short Term Goals - 11/05/20 1140       PT SHORT TERM GOAL #1   Title Shaquna will report subjective 50% improvement in her R shoulder symptoms.    Baseline Can be 7/10    Time 4    Period Weeks    Status Achieved    Target Date 11/12/20      PT SHORT TERM GOAL #2   Title Shawndra will be independent with her day 1 HEP.    Time 2    Period Weeks    Status Achieved    Target Date 10/29/20               PT Long Term Goals - 10/15/20 1535       PT LONG TERM GOAL #1   Title Improve FOTO to 80.    Baseline 48    Time 8    Period Weeks    Status New    Target Date 12/10/20      PT LONG TERM GOAL #2   Title Davie will report R shoulder pain consistently 0-2/10 on the Numeric Pain Rating Scale.    Baseline Can be 5-7/10    Time 8    Period Weeks    Status New    Target Date 12/10/20      PT LONG TERM GOAL #3   Title Improve R shoulder strength to 100% of the uninvolved L.    Baseline ~77%    Time 8    Period Weeks    Status New    Target Date 12/10/20      PT LONG TERM GOAL #4   Title Takenya will be independent and compliant with her long-term HEP at DC.    Time 8    Period Weeks    Status New    Target Date 12/10/20                   Plan - 11/05/20 1141     Clinical Impression Statement Tailyn notes progress with her Cameron and AROM since starting PT.  AROM assessment also shows progress.  We will reassess strength next week to make recommendations for transfer into independent PT, additional PT, or transfer back to Dr. Erlinda Hong for further testing based on objective strength progress and subjective feedback from [place].    Examination-Activity Limitations Dressing;Cameron;Bed Mobility;Lift;Reach Overhead    Examination-Participation Restrictions [employer] Activity    Stability/Clinical Decision Making Stable/Uncomplicated  Rehab Potential Good    PT Frequency 1x / week    PT Duration 8 weeks    PT  Treatment/Interventions ADLs/Self Care Home Management;Moist Heat;Cryotherapy;Therapeutic activities;Therapeutic exercise;Neuromuscular re-education;Patient/family education;Manual techniques;Passive range of motion    PT Next Visit Plan Reassess strength for additional recommendations regarding PT.    PT Home Exercise Plan [email or web address]    Consulted and Agree with Plan of Care Patient             Patient will benefit from skilled therapeutic intervention in order to improve the following deficits and impairments:  Decreased endurance, Decreased range of motion, Decreased strength, Increased edema, Impaired UE functional use, Pain  Visit Diagnosis: Muscle weakness (generalized)  Stiffness of right shoulder, not elsewhere classified  Acute pain of right shoulder     Problem List Patient Active Problem List   Diagnosis Date Noted   Post-operative state 11/28/2017   Mastodynia, female 08/31/2015   Abdominal pain, right upper quadrant 07/24/2014   Dysmenorrhea 07/08/2013   Mild cervical dysplasia 07/06/2011   Hypercholesterolemia 06/23/2011   Weight gain 06/23/2011   Family history of diabetes mellitus 06/23/2011    Farley Ly, PT, MPT 11/05/2020, 11:44 AM  [facility] Physical Therapy [address] [place], [place], [address] Phone: [phone]   Fax:  [phone]  Name: Abbigale Mcelhaney MRN: [id number] Date of Birth: 1970-02-14

## 2020-11-05 NOTE — Patient Instructions (Signed)
Access Code: [email or web address] URL: [email or web address] Date: 11/05/2020 Prepared by: Vista Mink  Exercises Standing Scapular Retraction - 5 x daily - 7 x weekly - 1 sets - 5 reps - 5 second hold Supine Scapular Protraction in Flexion with Dumbbells - 2 x daily - 7 x weekly - 1 sets - 20 reps - 3 seconds hold Shoulder External Rotation with Anchored Resistance with Towel Under Elbow - 2 x daily - 7 x weekly - 2-3 sets - 10 reps - 3 hold Shoulder Internal Rotation with Resistance - 2 x daily - 7 x weekly - 1-2 sets - 10 reps Standing Shoulder Posterior Capsule Stretch (Mirrored) - 2 x daily - 7 x weekly - 1 sets - 5 reps - 10 seconds hold

## 2020-11-12 ENCOUNTER — Encounter: Payer: Self-pay | Admitting: Rehabilitative and Restorative Service Providers"

## 2020-11-12 ENCOUNTER — Ambulatory Visit (INDEPENDENT_AMBULATORY_CARE_PROVIDER_SITE_OTHER): Payer: Self-pay | Admitting: Rehabilitative and Restorative Service Providers"

## 2020-11-12 ENCOUNTER — Other Ambulatory Visit: Payer: Self-pay

## 2020-11-12 DIAGNOSIS — M6281 Muscle weakness (generalized): Secondary | ICD-10-CM

## 2020-11-12 DIAGNOSIS — M25611 Stiffness of right shoulder, not elsewhere classified: Secondary | ICD-10-CM

## 2020-11-12 DIAGNOSIS — M25511 Pain in right shoulder: Secondary | ICD-10-CM

## 2020-11-12 NOTE — Therapy (Signed)
[facility] Physical Therapy [address] [place], [place], [address] Phone: [phone]   Fax:  [phone]  Physical Therapy Treatment  Patient Details  Name: Gabrielle Cameron MRN: [id number] Date of Birth: 04-May-1970 Referring Provider (PT): Leandrew Koyanagi MD   Encounter Date: 11/12/2020   PT End of Session - 11/12/20 1154     Visit Number 5    Number of Visits 8    Date for PT Re-Evaluation 12/10/20    PT Start Time 4097    PT Stop Time 1143    PT Time Calculation (min) 40 min    Activity Tolerance Patient tolerated treatment well;No increased pain    Behavior During Therapy WFL for tasks assessed/performed             Past Medical History:  Diagnosis Date   High risk HPV infection    Hyperlipidemia    Osteoarthritis    Vitamin D deficiency     Past Surgical History:  Procedure Laterality Date   ENDOMETRIAL ABLATION  12/08/2006   HER OPTION   LAPAROSCOPIC VAGINAL HYSTERECTOMY WITH SALPINGECTOMY Bilateral 11/28/2017   Procedure: LAPAROSCOPIC ASSISTED VAGINAL HYSTERECTOMY WITH SALPINGECTOMY AND LEFT OOPHERECTOMY;  Surgeon: [name], MD;  Location: [place];  Service: Gynecology;  Laterality: Bilateral;   MOUTH SURGERY      There were no vitals filed for this visit.   Subjective Assessment - 11/12/20 1105     Subjective Driving and making tortilas are getting better.  Sleep has improved but still anterior chest can be sharp..    Patient is accompained by: Interpreter   interpreter present but Pt. communicated directly in [place] and signed paper to wave service going forward.   Pertinent History NA    Limitations Lifting    Patient Stated Goals Be able to use R shoulder without pain    Currently in Pain? Yes    Pain Score 4     Pain Location Shoulder    Pain Orientation Right    Pain Descriptors / Indicators Aching    Pain Type Acute pain    Pain Radiating Towards Upper trapezius and chest (nothing  to lateral elbow)    Pain Onset More than a month ago    Pain Frequency Intermittent    Aggravating Factors  Feels a bit with all activities    Pain Relieving Factors Is careful with R shoulder use    Effect of Pain on Daily Activities Is doing normal activities with some discomfort    Multiple Pain Sites No                [facility] PT Assessment - 11/12/20 0001       ROM / Strength   AROM / PROM / Strength Strength      Strength   Overall Strength Deficits    Strength Assessment Site Shoulder    Right/Left Shoulder Left;Right    Right Shoulder Internal Rotation --   26.9 pounds   Right Shoulder External Rotation --   22.8 pounds   Left Shoulder Internal Rotation --   35.7 pounds   Left Shoulder External Rotation --   25.9 pounds                          [facility] Adult PT Treatment/Exercise - 11/12/20 0001       Exercises   Exercises Shoulder      Shoulder Exercises: Supine   Protraction Strengthening;Right;20 reps;Limitations  Protraction Weight (lbs) 5    Protraction Limitations 3 seconds      Shoulder Exercises: Seated   Retraction Strengthening    Retraction Limitations 10X 5 seconds SBP      Shoulder Exercises: Prone   Other Prone Exercises Prone 90 & 100 degrees thumbs up  2 sets of 5X 3 seconds each      Shoulder Exercises: Standing   External Rotation Strengthening;Right;10 reps;Theraband;Limitations    Theraband Level (Shoulder External Rotation) Level 3 (Green)    External Rotation Limitations 2 sets with 3 second hold and slow eccentrics    Internal Rotation Strengthening;Right;10 reps;Theraband;Limitations    Theraband Level (Shoulder Internal Rotation) Level 3 (Green)    Internal Rotation Limitations 3 seconds with slow eccentrics    Row 20 reps;Both    Theraband Level (Shoulder Row) Level 4 [facility])                     PT Education - 11/12/20 1152     Education Details Reviewed HEP with progression of RTC strengthening.   Reviewed strength RA results.    Person(s) Educated Patient    Methods Explanation;Demonstration;Verbal cues;Handout    Comprehension Verbalized understanding;Need further instruction;Returned demonstration;Verbal cues required              PT Short Term Goals - 11/05/20 1140       PT SHORT TERM GOAL #1   Title Gabrielle Cameron will report subjective 50% improvement in her R shoulder symptoms.    Baseline Can be 7/10    Time 4    Period Weeks    Status Achieved    Target Date 11/12/20      PT SHORT TERM GOAL #2   Title Gabrielle Cameron will be independent with her day 1 HEP.    Time 2    Period Weeks    Status Achieved    Target Date 10/29/20               PT Long Term Goals - 11/12/20 1153       PT LONG TERM GOAL #1   Title Improve FOTO to 80.    Baseline 69 (was 48)    Time 4    Period Weeks    Status On-going    Target Date 12/10/20      PT LONG TERM GOAL #2   Title Gabrielle Cameron will report R shoulder pain consistently 0-2/10 on the Numeric Pain Rating Scale.    Baseline Can be 4/10 (was 5-7/10)    Time 4    Period Weeks    Status On-going    Target Date 12/10/20      PT LONG TERM GOAL #3   Title Improve R shoulder strength to 100% of the uninvolved L.    Baseline See objective.  Improved but still room to improve.    Time 4    Period Weeks    Status On-going    Target Date 12/10/20      PT LONG TERM GOAL #4   Title Gabrielle Cameron will be independent and compliant with her long-term HEP at DC.    Time 8    Period Weeks    Status On-going    Target Date 12/10/20                   Plan - 11/12/20 1155     Clinical Impression Statement Gabrielle Cameron notes better AROM, pain, sleep and function since starting PT.  Things like using a  tortilla press and driving are easier than 4 weeks ago.  She still has some R anterior chest/shoulder pain with various activities, although it does not stop her from doing things.  Because she is independent with her updated HEP which is addressing remaining  impairments, I recommend Gabrielle Cameron continue her home exercises independently with a follow-up RA in 3-4 weeks to assess progress and make additional recommendations.    Examination-Activity Limitations Dressing;Sleep;Bed Mobility;Lift;Reach Overhead    Examination-Participation Restrictions Yard Work;Community Activity    Stability/Clinical Decision Making Stable/Uncomplicated    Rehab Potential Good    PT Frequency Other (comment)    PT Duration Other (comment)   Follow-up in 3-4 weeks   PT Treatment/Interventions ADLs/Self Care Home Management;Moist Heat;Cryotherapy;Therapeutic activities;Therapeutic exercise;Neuromuscular re-education;Patient/family education;Manual techniques;Passive range of motion    PT Next Visit Plan Full RA with FOTO.  DC?  Refer back to MD?  Much better but lingering pain anterior chest.    PT Home Exercise Plan [email or web address]    Consulted and Agree with Plan of Care Patient             Patient will benefit from skilled therapeutic intervention in order to improve the following deficits and impairments:  Decreased endurance, Decreased range of motion, Decreased strength, Increased edema, Impaired UE functional use, Pain  Visit Diagnosis: Muscle weakness (generalized)  Stiffness of right shoulder, not elsewhere classified  Acute pain of right shoulder     Problem List Patient Active Problem List   Diagnosis Date Noted   Post-operative state 11/28/2017   Mastodynia, female 08/31/2015   Abdominal pain, right upper quadrant 07/24/2014   Dysmenorrhea 07/08/2013   Mild cervical dysplasia 07/06/2011   Hypercholesterolemia 06/23/2011   Weight gain 06/23/2011   Family history of diabetes mellitus 06/23/2011    Farley Ly, PT, MPT 11/12/2020, 11:59 AM  [facility] Physical Therapy [address] [place], [place], [address] Phone: [phone]   Fax:  [phone]  Name: Gabrielle Cameron MRN: [id number] Date of Birth:  07-18-1970

## 2020-11-12 NOTE — Patient Instructions (Signed)
Access Code: [email or web address] URL: [email or web address] Date: 11/12/2020 Prepared by: Vista Mink  Exercises Standing Scapular Retraction - 5 x daily - 7 x weekly - 1 sets - 5 reps - 5 second hold Supine Scapular Protraction in Flexion with Dumbbells - 2 x daily - 7 x weekly - 1 sets - 20 reps - 3 seconds hold Shoulder External Rotation with Anchored Resistance with Towel Under Elbow - 2 x daily - 7 x weekly - 2-3 sets - 10 reps - 3 hold Shoulder Internal Rotation with Resistance - 2 x daily - 7 x weekly - 1-2 sets - 10 reps Standing Shoulder Posterior Capsule Stretch (Mirrored) - 2 x daily - 7 x weekly - 1 sets - 5 reps - 10 seconds hold Prone Shoulder Horizontal Abduction with Thumbs Up - 1 x daily - 7 x weekly - 2 sets - 5-10 reps - 3 seconds hold

## 2020-11-16 ENCOUNTER — Other Ambulatory Visit: Payer: Self-pay | Admitting: Obstetrics & Gynecology

## 2020-11-16 DIAGNOSIS — Z1231 Encounter for screening mammogram for malignant neoplasm of breast: Secondary | ICD-10-CM

## 2020-11-19 ENCOUNTER — Encounter: Payer: Self-pay | Admitting: Rehabilitative and Restorative Service Providers"

## 2020-11-26 ENCOUNTER — Other Ambulatory Visit: Payer: Self-pay

## 2020-11-26 ENCOUNTER — Encounter: Payer: Self-pay | Admitting: Rehabilitative and Restorative Service Providers"

## 2020-11-26 ENCOUNTER — Ambulatory Visit: Payer: Self-pay | Admitting: Rehabilitative and Restorative Service Providers"

## 2020-11-26 DIAGNOSIS — M25611 Stiffness of right shoulder, not elsewhere classified: Secondary | ICD-10-CM

## 2020-11-26 DIAGNOSIS — M25511 Pain in right shoulder: Secondary | ICD-10-CM

## 2020-11-26 DIAGNOSIS — M6281 Muscle weakness (generalized): Secondary | ICD-10-CM

## 2020-11-26 NOTE — Patient Instructions (Signed)
Access Code: [email or web address] URL: [email or web address] Date: 11/26/2020 Prepared by: Vista Mink  Exercises Standing Scapular Retraction - 5 x daily - 7 x weekly - 1 sets - 5 reps - 5 second hold Supine Scapular Protraction in Flexion with Dumbbells - 1 x daily - 3 x weekly - 1 sets - 20 reps - 3 seconds hold Shoulder External Rotation with Anchored Resistance with Towel Under Elbow - 1 x daily - 3 x weekly - 2 sets - 10 reps - 3 hold Shoulder Internal Rotation with Resistance - 1 x daily - 3 x weekly - 1 sets - 10 reps Standing Shoulder Posterior Capsule Stretch (Mirrored) - 1 x daily - 7 x weekly - 1 sets - 5 reps - 10 seconds hold Prone Shoulder Horizontal Abduction with Thumbs Up - 1 x daily - 3 x weekly - 1 sets - 10 reps - 3 seconds hold

## 2020-11-26 NOTE — Therapy (Signed)
[facility] Physical Therapy [address] [place], [place], [address] Phone: [phone]   Fax:  [phone]  Physical Therapy Treatment/DC  Patient Details  Name: Gabrielle Cameron MRN: [id number] Date of Birth: 1970/11/08 Referring Provider (PT): Leandrew Koyanagi MD  PHYSICAL THERAPY DISCHARGE SUMMARY  Visits from Start of Care: 6  Current functional level related to goals / functional outcomes: See RA   Remaining deficits: Minimal   Education / Equipment: Updated HEP   Patient agrees to discharge. Patient goals were met. Patient is being discharged due to being pleased with the current functional level.  Encounter Date: 11/26/2020   PT End of Session - 11/26/20 1446     Visit Number 6    Number of Visits 8    Date for PT Re-Evaluation 12/10/20    PT Start Time 1101    PT Stop Time 1141    PT Time Calculation (min) 40 min    Activity Tolerance Patient tolerated treatment well;No increased pain    Behavior During Therapy WFL for tasks assessed/performed             Past Medical History:  Diagnosis Date   High risk HPV infection    Hyperlipidemia    Osteoarthritis    Vitamin D deficiency     Past Surgical History:  Procedure Laterality Date   ENDOMETRIAL ABLATION  12/08/2006   HER OPTION   LAPAROSCOPIC VAGINAL HYSTERECTOMY WITH SALPINGECTOMY Bilateral 11/28/2017   Procedure: LAPAROSCOPIC ASSISTED VAGINAL HYSTERECTOMY WITH SALPINGECTOMY AND LEFT OOPHERECTOMY;  Surgeon: [name], MD;  Location: [place];  Service: Gynecology;  Laterality: Bilateral;   MOUTH SURGERY      There were no vitals filed for this visit.   Subjective Assessment - 11/26/20 1108     Subjective Driving, using a tortilla press and sleeping are no longer functionally limiting.  Ellah has had minimal pain over the past week.    Patient is accompained by: --   interpreter present but Pt. communicated directly in [place] and signed paper  to wave service going forward.   Pertinent History NA    Limitations Lifting    Patient Stated Goals Be able to use R shoulder without pain    Currently in Pain? No/denies    Pain Score 0-No pain    Pain Location Shoulder    Pain Orientation Right    Pain Descriptors / Indicators Aching    Pain Radiating Towards Little bit of chest    Pain Onset More than a month ago    Pain Frequency Rarely    Aggravating Factors  Sleeping on the R side    Pain Relieving Factors Uses R arm for everything normally now    Effect of Pain on Daily Activities Occasionally wakes up with 2-3/10 R chest pain but not every night.  Chelli is able to go back to sleep by shifting to her back or L side.    Multiple Pain Sites No                [facility] PT Assessment - 11/26/20 0001       ROM / Strength   AROM / PROM / Strength AROM;Strength      AROM   Overall AROM  Within functional limits for tasks performed    AROM Assessment Site Shoulder    Right/Left Shoulder Right    Right Shoulder Flexion 180 Degrees    Right Shoulder Internal Rotation 75 Degrees    Right Shoulder External  Rotation 95 Degrees    Right Shoulder Horizontal  ADduction 35 Degrees      Strength   Overall Strength Deficits    Strength Assessment Site Shoulder    Right/Left Shoulder Right    Right Shoulder Internal Rotation --   31.4 pounds   Right Shoulder External Rotation --   26.1 pounds                          [facility] Adult PT Treatment/Exercise - 11/26/20 0001       Exercises   Exercises Shoulder      Shoulder Exercises: Supine   Protraction Strengthening;Right;20 reps;Limitations    Protraction Weight (lbs) 6    Protraction Limitations 3 seconds      Shoulder Exercises: Seated   Retraction Strengthening    Retraction Limitations 10X 5 seconds SBP      Shoulder Exercises: Prone   Other Prone Exercises Prone 90 & 100 degrees thumbs up  2 sets of 5X 3 seconds each      Shoulder Exercises: Standing    External Rotation Strengthening;Right;10 reps;Theraband;Limitations    Theraband Level (Shoulder External Rotation) Level 3 (Green)    External Rotation Limitations 2 sets with 3 second hold and slow eccentrics    Internal Rotation Strengthening;Right;10 reps;Theraband;Limitations    Theraband Level (Shoulder Internal Rotation) Level 3 (Green)    Internal Rotation Limitations 3 seconds with slow eccentrics    Row 20 reps;Both    Theraband Level (Shoulder Row) Level 4 [facility])                     PT Education - 11/26/20 1443     Education Details Updated and reviewed HEP.  Reviewed RA and long-term management plan.    Person(s) Educated Patient    Methods Explanation;Demonstration;Tactile cues;Verbal cues;Handout    Comprehension Verbalized understanding;Tactile cues required;Returned demonstration;Verbal cues required              PT Short Term Goals - 11/05/20 1140       PT SHORT TERM GOAL #1   Title Cathalina will report subjective 50% improvement in her R shoulder symptoms.    Baseline Can be 7/10    Time 4    Period Weeks    Status Achieved    Target Date 11/12/20      PT SHORT TERM GOAL #2   Title Ashlyn will be independent with her day 1 HEP.    Time 2    Period Weeks    Status Achieved    Target Date 10/29/20               PT Long Term Goals - 11/26/20 1444       PT LONG TERM GOAL #1   Title Improve FOTO to 80.    Baseline 101 (was 48)    Time 4    Period Weeks    Status Achieved      PT LONG TERM GOAL #2   Title Everley will report R shoulder pain consistently 0-2/10 on the Numeric Pain Rating Scale.    Baseline 0/10 (was 5-7/10)    Time 4    Period Weeks    Status Achieved      PT LONG TERM GOAL #3   Title Improve R shoulder strength to 100% of the uninvolved L.    Baseline See objective.  Improved but still room to improve.    Time 4  Period Weeks    Status Partially Met      PT LONG TERM GOAL #4   Title Hallee will be independent and  compliant with her long-term HEP at DC.    Time 8    Period Weeks    Status Achieved                   Plan - 11/26/20 1446     Clinical Impression Statement Shalaina has not had shoulder pain in over a week.  Mild chest pain when sleeping on the R side is noted but it doesn't wake her up and she immediately goes back to sleep when she gets off her R shoulder.  She has demonstarted independence and compliance with her HEP and appears ready for DC into independent rehabilitation.    Examination-Activity Limitations Dressing;Sleep;Bed Mobility;Lift;Reach Overhead    Examination-Participation Restrictions Yard Work;Community Activity    Stability/Clinical Decision Making Stable/Uncomplicated    Rehab Potential Good    PT Frequency Other (comment)    PT Duration Other (comment)   DC   PT Treatment/Interventions ADLs/Self Care Home Management;Moist Heat;Cryotherapy;Therapeutic activities;Therapeutic exercise;Neuromuscular re-education;Patient/family education;Manual techniques;Passive range of motion    PT Next Visit Plan DC    PT Home Exercise Plan [email or web address]    Consulted and Agree with Plan of Care Patient             Patient will benefit from skilled therapeutic intervention in order to improve the following deficits and impairments:  Decreased endurance, Decreased range of motion, Decreased strength, Increased edema, Impaired UE functional use, Pain  Visit Diagnosis: Muscle weakness (generalized)  Stiffness of right shoulder, not elsewhere classified  Acute pain of right shoulder     Problem List Patient Active Problem List   Diagnosis Date Noted   Post-operative state 11/28/2017   Mastodynia, female 08/31/2015   Abdominal pain, right upper quadrant 07/24/2014   Dysmenorrhea 07/08/2013   Mild cervical dysplasia 07/06/2011   Hypercholesterolemia 06/23/2011   Weight gain 06/23/2011   Family history of diabetes mellitus 06/23/2011    Farley Ly, PT, MPT 11/26/2020,  2:49 PM  [facility] [address] [place], [place], [address] Phone: [phone]   Fax:  [phone]  Name: Laketa Sandoz MRN: [id number] Date of Birth: July 04, 1970

## 2020-12-03 ENCOUNTER — Encounter: Payer: Self-pay | Admitting: Rehabilitative and Restorative Service Providers"

## 2020-12-10 ENCOUNTER — Encounter: Payer: Self-pay | Admitting: Rehabilitative and Restorative Service Providers"

## 2020-12-22 ENCOUNTER — Ambulatory Visit
Admission: RE | Admit: 2020-12-22 | Discharge: 2020-12-22 | Disposition: A | Discharge status: Home / Self Care | Payer: No Typology Code available for payment source | Source: Ambulatory Visit | Attending: Obstetrics & Gynecology | Admitting: Obstetrics & Gynecology

## 2020-12-22 DIAGNOSIS — Z1231 Encounter for screening mammogram for malignant neoplasm of breast: Secondary | ICD-10-CM

## 2020-12-23 ENCOUNTER — Other Ambulatory Visit: Payer: Self-pay | Admitting: Obstetrics & Gynecology

## 2020-12-23 DIAGNOSIS — R928 Other abnormal and inconclusive findings on diagnostic imaging of breast: Secondary | ICD-10-CM

## 2021-01-24 DIAGNOSIS — R87619 Unspecified abnormal cytological findings in specimens from cervix uteri: Secondary | ICD-10-CM

## 2021-01-24 HISTORY — DX: Unspecified abnormal cytological findings in specimens from cervix uteri: R87.619

## 2021-02-03 ENCOUNTER — Ambulatory Visit
Admission: RE | Admit: 2021-02-03 | Discharge: 2021-02-03 | Disposition: A | Discharge status: Home / Self Care | Payer: No Typology Code available for payment source | Source: Ambulatory Visit | Attending: Obstetrics & Gynecology | Admitting: Obstetrics & Gynecology

## 2021-02-03 ENCOUNTER — Ambulatory Visit: Payer: No Typology Code available for payment source

## 2021-02-03 DIAGNOSIS — R928 Other abnormal and inconclusive findings on diagnostic imaging of breast: Secondary | ICD-10-CM

## 2021-05-06 ENCOUNTER — Ambulatory Visit (INDEPENDENT_AMBULATORY_CARE_PROVIDER_SITE_OTHER): Payer: Self-pay | Admitting: Obstetrics & Gynecology

## 2021-05-06 ENCOUNTER — Encounter: Payer: Self-pay | Admitting: Obstetrics & Gynecology

## 2021-05-06 ENCOUNTER — Other Ambulatory Visit (HOSPITAL_COMMUNITY)
Admission: RE | Admit: 2021-05-06 | Discharge: 2021-05-06 | Disposition: A | Discharge status: Home / Self Care | Payer: No Typology Code available for payment source | Source: Ambulatory Visit | Attending: Obstetrics & Gynecology | Admitting: Obstetrics & Gynecology

## 2021-05-06 VITALS — BP 120/76 | HR 70 | Resp 16 | Ht 63.75 in | Wt 174.0 lb

## 2021-05-06 DIAGNOSIS — Z6830 Body mass index (BMI) 30.0-30.9, adult: Secondary | ICD-10-CM

## 2021-05-06 DIAGNOSIS — Z01419 Encounter for gynecological examination (general) (routine) without abnormal findings: Secondary | ICD-10-CM | POA: Insufficient documentation

## 2021-05-06 DIAGNOSIS — E6609 Other obesity due to excess calories: Secondary | ICD-10-CM

## 2021-05-06 DIAGNOSIS — Z1272 Encounter for screening for malignant neoplasm of vagina: Secondary | ICD-10-CM

## 2021-05-06 DIAGNOSIS — Z90710 Acquired absence of both cervix and uterus: Secondary | ICD-10-CM

## 2021-05-06 DIAGNOSIS — N870 Mild cervical dysplasia: Secondary | ICD-10-CM

## 2021-05-06 DIAGNOSIS — R300 Dysuria: Secondary | ICD-10-CM

## 2021-05-06 DIAGNOSIS — N898 Other specified noninflammatory disorders of vagina: Secondary | ICD-10-CM

## 2021-05-06 LAB — WET PREP FOR TRICH, YEAST, CLUE

## 2021-05-06 MED ORDER — FLUCONAZOLE 150 MG PO TABS: 150.0000 mg | ORAL_TABLET | ORAL | 1 refills | Status: DC

## 2021-05-06 NOTE — Progress Notes (Signed)
? ? ? ?Gabrielle Cameron 1970/04/29 [id number] ? ? ?History:    51 y.o. G61P4L4  Married. 4 girls.  1 grand-daughter 81 yo. ?  ?RP:  Established patient presenting for annual gyn exam  ?  ?HPI: S/P LAVH/Left Oophorectomy/Bilateral Salpingectomy 11/28/2017.  No pelvic pain. Increased vaginal discharge with irritation and odor.  No pain with intercourse.  Pap Neg 04/2019.  No h/o abnormal Pap.  Pap reflex today. Breasts normal.  Mammo 11/2020, Left Neg, Rt Dx mammo/US Neg 01/2021.   Pt c/o burning with urination 2 weeks ago but no problems today. Bowel movements normal.  Breast normal.  Body mass index improved at 30.1. Walking and reducing Cal/Carbs.  Health labs with Fam MD.  No Colono yet, will schedule this year. ? ?Past medical history,surgical history, family history and social history were all reviewed and documented in the EPIC chart. ? ?Gynecologic History ?Patient's last menstrual period was 11/04/2017 (approximate). ? ?Obstetric History ?OB History  ?Gravida Para Term Preterm AB Living  ?'4 4 4     4  '$ ?SAB IAB Ectopic Multiple Live Births  ?        4  ?  ?# Outcome Date GA Lbr Len/2nd Weight Sex Delivery Anes PTL Lv  ?4 Term     F Vag-Spont  N LIV  ?3 Term     F Vag-Spont  N LIV  ?2 Term     F Vag-Spont  N LIV  ?1 Term     F Vag-Spont  N LIV  ? ? ? ?ROS: A ROS was performed and pertinent positives and negatives are included in the history. ? GENERAL: No fevers or chills. HEENT: No change in vision, no earache, sore throat or sinus congestion. NECK: No pain or stiffness. CARDIOVASCULAR: No chest pain or pressure. No palpitations. PULMONARY: No shortness of breath, cough or wheeze. GASTROINTESTINAL: No abdominal pain, nausea, vomiting or diarrhea, melena or bright red blood per rectum. GENITOURINARY: No urinary frequency, urgency, hesitancy or dysuria. MUSCULOSKELETAL: No joint or muscle pain, no back pain, no recent trauma. DERMATOLOGIC: No rash, no itching, no lesions. ENDOCRINE: No polyuria, polydipsia,  no heat or cold intolerance. No recent change in weight. HEMATOLOGICAL: No anemia or easy bruising or bleeding. NEUROLOGIC: No headache, seizures, numbness, tingling or weakness. PSYCHIATRIC: No depression, no loss of interest in normal activity or change in sleep pattern.  ?  ? ?Exam: ? ? ?BP 120/76   Pulse 70   Resp 16   Ht 5' 3.75" (1.619 m)   Wt 174 lb (78.9 kg)   LMP 11/04/2017 (Approximate)   BMI 30.10 kg/m?  ? ?Body mass index is 30.1 kg/m?. ? ?General appearance : Well developed well nourished female. No acute distress ?HEENT: Eyes: no retinal hemorrhage or exudates,  Neck supple, trachea midline, no carotid bruits, no thyroidmegaly ?Lungs: Clear to auscultation, no rhonchi or wheezes, or rib retractions  ?Heart: Regular rate and rhythm, no murmurs or gallops ?Breast:Examined in sitting and supine position were symmetrical in appearance, no palpable masses or tenderness,  no skin retraction, no nipple inversion, no nipple discharge, no skin discoloration, no axillary or supraclavicular lymphadenopathy ?Abdomen: no palpable masses or tenderness, no rebound or guarding ?Extremities: no edema or skin discoloration or tenderness ? ?Pelvic: Vulva: Normal ?            Vagina: No gross lesions.  Increased discharge.  Wet prep done.  Pap reflex done. ? Cervix/Uterus absent ? Adnexa  Without masses or tenderness ?  Anus: Normal ? ?U/A: Dark yellow slightly cloudy, protein negative, nitrites negative, white blood cells 6-10, red blood cells 3-10, bacteria few, yeasts few.  Urine culture pending. ?Wet prep: Yeasts with budding and pseudohyphae. ? ? ?Assessment/Plan:  51 y.o. female for annual exam  ? ?1. Encounter for Papanicolaou smear of vagina as part of routine gynecological examination ?S/P LAVH/Left Oophorectomy/Bilateral Salpingectomy 11/28/2017.  No pelvic pain. Increased vaginal discharge with irritation and odor.  No pain with intercourse.  Pap Neg 04/2019.  No h/o abnormal Pap.  Pap reflex today. Breasts  normal.  Mammo 11/2020, Left Neg, Rt Dx mammo/US Neg 01/2021.   Pt c/o burning with urination 2 weeks ago but no problems today. Bowel movements normal.  Breast normal.  Body mass index improved at 30.1. Walking and reducing Cal/Carbs.  Health labs with Fam MD.  No Colono yet, will schedule this year. ?- Cytology - PAP( [facility]) ? ?2. Mild cervical dysplasia ?- Cytology - PAP( [place]) ? ?3. S/P laparoscopic assisted vaginal hysterectomy (LAVH) ? ?4. Burning with urination ?Mild disturbance of urine analysis, probable contamination.  Will wait on urine culture to decide if treatment is needed. ?- Urinalysis,Complete w/RFL Culture ? ?5. Vaginal discharge ?Severe yeast vaginitis confirmed by wet prep.  Will treat with fluconazole 1 tablet per mouth every 2 days x 3 as needed.  Usage reviewed and prescription sent to pharmacy. ?- WET PREP FOR [place], YEAST, CLUE ? ?6. Class 1 obesity due to excess calories without serious comorbidity with body mass index (BMI) of 30.0 to 30.9 in adult  ?Much improved BMI with daily walks and reduced calories/carb in nutrition. ? ?Other orders ?- fluconazole (DIFLUCAN) 150 MG tablet; Take 1 tablet (150 mg total) by mouth every other day. 1 tab PO every 2 days x 3 tab as needed.  ? ?[name] MD, 8:09 AM 05/06/2021 ? ?  ?

## 2021-05-08 LAB — URINALYSIS, COMPLETE W/RFL CULTURE
Bilirubin Urine: NEGATIVE
Casts: NONE SEEN /LPF
Crystals: NONE SEEN /HPF
Glucose, UA: NEGATIVE
Hyaline Cast: NONE SEEN /LPF
Nitrites, Initial: NEGATIVE
Protein, ur: NEGATIVE
Specific Gravity, Urine: 1.015 (ref 1.001–1.035)
pH: 7 (ref 5.0–8.0)

## 2021-05-08 LAB — URINE CULTURE
MICRO NUMBER:: 13259362
Result:: NO GROWTH
SPECIMEN QUALITY:: ADEQUATE

## 2021-05-08 LAB — CULTURE INDICATED

## 2021-05-12 LAB — CYTOLOGY - PAP

## 2021-05-14 ENCOUNTER — Other Ambulatory Visit: Payer: Self-pay

## 2021-05-14 DIAGNOSIS — R87612 Low grade squamous intraepithelial lesion on cytologic smear of cervix (LGSIL): Secondary | ICD-10-CM

## 2021-06-08 ENCOUNTER — Encounter: Payer: Self-pay | Admitting: Obstetrics & Gynecology

## 2021-06-08 ENCOUNTER — Ambulatory Visit (INDEPENDENT_AMBULATORY_CARE_PROVIDER_SITE_OTHER): Payer: Self-pay | Admitting: Obstetrics & Gynecology

## 2021-06-08 ENCOUNTER — Other Ambulatory Visit (HOSPITAL_COMMUNITY)
Admission: RE | Admit: 2021-06-08 | Discharge: 2021-06-08 | Disposition: A | Discharge status: Home / Self Care | Payer: No Typology Code available for payment source | Source: Ambulatory Visit | Attending: Obstetrics & Gynecology | Admitting: Obstetrics & Gynecology

## 2021-06-08 DIAGNOSIS — R87612 Low grade squamous intraepithelial lesion on cytologic smear of cervix (LGSIL): Secondary | ICD-10-CM | POA: Insufficient documentation

## 2021-06-08 DIAGNOSIS — R87622 Low grade squamous intraepithelial lesion on cytologic smear of vagina (LGSIL): Secondary | ICD-10-CM

## 2021-06-08 NOTE — Progress Notes (Signed)
? ? ?  Gabrielle Cameron [facility] 1970/12/18 [id number] ? ? ?     51 y.o.  [id number]  ? ?RP: LGSIL for Vaginal Colposcopy ? ?HPI: S/P LAVH/Left Oophorectomy/Bilateral Salpingectomy 11/28/2017.  No pelvic pain. Increased vaginal discharge with irritation and odor.  No pain with intercourse.  Pap Neg 04/2019.  No h/o abnormal Pap.  Pap 05/06/21 with LGSIL. ? ? ?OB History  ?Gravida Para Term Preterm AB Living  ?'4 4 4     4  '$ ?SAB IAB Ectopic Multiple Live Births  ?        4  ?  ?# Outcome Date GA Lbr Len/2nd Weight Sex Delivery Anes PTL Lv  ?4 Term     F Vag-Spont  N LIV  ?3 Term     F Vag-Spont  N LIV  ?2 Term     F Vag-Spont  N LIV  ?1 Term     F Vag-Spont  N LIV  ? ? ?Past medical history,surgical history, problem list, medications, allergies, family history and social history were all reviewed and documented in the EPIC chart. ? ? ?Directed ROS with pertinent positives and negatives documented in the history of present illness/assessment and plan. ? ?Exam: ? ?Vitals:  ? 06/08/21 1058  ?BP: 120/74  ? ?General appearance:  Normal ? ?Colposcopy Procedure Note ?Gabrielle Cameron ?06/08/2021 ? ?Indications:  LGSIL of the vagina for Colposcopy ? ?Procedure Details  ?The risks and benefits of the procedure and Written informed consent obtained. ? ?Speculum placed in vagina and excellent visualization of cervix achieved, cervix swabbed x 3 with acetic acid solution. ? ?Findings: ? ?Vaginal colposcopy:  AW with punctation at the left vaginal vault.  Bx taken. ? ?Vulvar colposcopy: Normal ? ?Perirectal colposcopy: Normal ? ?The cervix was sprayed with Hurricane before performing the cervical biopsies. ? ?Specimens: HR HPV (vagina).  Vaginal Bx. ? ?Complications: No Cx, good hemostasis with Silver Nitrate. ?. ?Plan:  Management per HPV HR and Vaginal Bx results. ? ? ?Assessment/Plan:  50 y.o. [id number]  ? ?1. LGSIL on Pap smear of vagina ?S/P LAVH/Left Oophorectomy/Bilateral Salpingectomy 11/28/2017.  No pelvic pain. Increased  vaginal discharge with irritation and odor.  No pain with intercourse.  Pap Neg 04/2019.  No h/o abnormal Pap.  Pap 05/06/21 with LGSIL.  Counseling on LGSIL of Vagina.  Colposcopy procedure explained.  Colpo findings reviewed.  Post procedure precautions reviewed.  Management per results. ?- Colposcopy ?- Cervicovaginal ancillary only( [place]) ?- Surgical pathology( / [facility])  ? ?[name] MD, 11:22 AM 06/08/2021 ? ? ? ?  ?

## 2021-06-10 LAB — SURGICAL PATHOLOGY

## 2021-06-14 LAB — CERVICOVAGINAL ANCILLARY ONLY
Comment: NEGATIVE
High risk HPV: NEGATIVE

## 2021-12-22 ENCOUNTER — Ambulatory Visit: Payer: Self-pay | Admitting: Obstetrics & Gynecology

## 2021-12-24 ENCOUNTER — Ambulatory Visit (INDEPENDENT_AMBULATORY_CARE_PROVIDER_SITE_OTHER): Payer: Self-pay | Admitting: Obstetrics & Gynecology

## 2021-12-24 ENCOUNTER — Encounter: Payer: Self-pay | Admitting: Obstetrics & Gynecology

## 2021-12-24 ENCOUNTER — Other Ambulatory Visit (HOSPITAL_COMMUNITY)
Admission: RE | Admit: 2021-12-24 | Discharge: 2021-12-24 | Disposition: A | Discharge status: Home / Self Care | Payer: Self-pay | Source: Ambulatory Visit | Attending: Obstetrics & Gynecology | Admitting: Obstetrics & Gynecology

## 2021-12-24 VITALS — BP 114/80 | HR 72

## 2021-12-24 DIAGNOSIS — N890 Mild vaginal dysplasia: Secondary | ICD-10-CM

## 2021-12-24 DIAGNOSIS — N898 Other specified noninflammatory disorders of vagina: Secondary | ICD-10-CM | POA: Insufficient documentation

## 2021-12-24 DIAGNOSIS — B029 Zoster without complications: Secondary | ICD-10-CM

## 2021-12-24 MED ORDER — VALACYCLOVIR HCL 1 G PO TABS: 1000.0000 mg | ORAL_TABLET | Freq: Two times a day (BID) | ORAL | 1 refills | Status: AC

## 2021-12-24 NOTE — Progress Notes (Signed)
    Gabrielle Cameron [facility] September 02, 1970 [id number]        50 y.o.  [id number]   RP: VAIN 1 for 6 month Pap  HPI: Pap 05/06/21 with LGSIL. HPV HR Neg.  VAIN 1 on Colpo 05/2021.  Repeat vaginal Pap reflex today.   S/P LAVH/Left Oophorectomy/Bilateral Salpingectomy 11/28/2017. Shingles x 1 week at the left lower back, left buttocks and left vulva.  Some burning sensation.  No treatment so far.   OB History  Gravida Para Term Preterm AB Living  '4 4 4     4  '$ SAB IAB Ectopic Multiple Live Births          4    # Outcome Date GA Lbr Len/2nd Weight Sex Delivery Anes PTL Lv  4 Term     F Vag-Spont  N LIV  3 Term     F Vag-Spont  N LIV  2 Term     F Vag-Spont  N LIV  1 Term     F Vag-Spont  N LIV    Past medical history,surgical history, problem list, medications, allergies, family history and social history were all reviewed and documented in the EPIC chart.   Directed ROS with pertinent positives and negatives documented in the history of present illness/assessment and plan.  Exam:  Vitals:   12/24/21 0928  BP: 114/80  Pulse: 72   General appearance:  Normal  Gynecologic exam: Vulva: Left with shingles.  Left buttocks and left lower back as well.  Lesions mostly dried.  Speculum:  Vagina normal.  Pap reflex done.   Assessment/Plan:  51 y.o. [id number]   1. VAIN I (vaginal intraepithelial neoplasia grade I) Pap 05/06/21 with LGSIL. HPV HR Neg.  VAIN 1 on Colpo 05/2021.  Repeat vaginal Pap reflex today.   S/P LAVH/Left Oophorectomy/Bilateral Salpingectomy 11/28/2017.  Pap reflex at vaginal vault done.  Management per results. - Cytology - PAP( [place])  2. Herpes zoster without complication Shingles x 1 week at the left lower back, left buttocks and left vulva.  Some burning sensation.  No treatment so far.  Will treat with Valacyclovir.  Usage reviewed, prescription sent to pharmacy.  Other orders - metroNIDAZOLE (METROCREAM) 0.75 % cream; SMARTSIG:Sparingly Topical Twice Daily -  MAGNESIUM PO; Take by mouth. - POTASSIUM PO; Take by mouth. - valACYclovir (VALTREX) 1000 MG tablet; Take 1 tablet (1,000 mg total) by mouth 2 (two) times daily for 14 days.   [name] MD, 10:05 AM 12/24/2021

## 2021-12-29 LAB — CYTOLOGY - PAP: Diagnosis: NEGATIVE

## 2022-05-10 ENCOUNTER — Encounter: Payer: Self-pay | Admitting: Obstetrics & Gynecology

## 2022-05-10 ENCOUNTER — Other Ambulatory Visit: Payer: Self-pay | Admitting: Obstetrics & Gynecology

## 2022-05-10 ENCOUNTER — Ambulatory Visit (INDEPENDENT_AMBULATORY_CARE_PROVIDER_SITE_OTHER): Payer: Self-pay | Admitting: Obstetrics & Gynecology

## 2022-05-10 ENCOUNTER — Other Ambulatory Visit (HOSPITAL_COMMUNITY)
Admission: RE | Admit: 2022-05-10 | Discharge: 2022-05-10 | Disposition: A | Discharge status: Home / Self Care | Payer: Self-pay | Source: Ambulatory Visit | Attending: Obstetrics & Gynecology | Admitting: Obstetrics & Gynecology

## 2022-05-10 ENCOUNTER — Telehealth: Payer: Self-pay

## 2022-05-10 VITALS — BP 114/72 | HR 98 | Ht 63.75 in | Wt 176.0 lb

## 2022-05-10 DIAGNOSIS — N890 Mild vaginal dysplasia: Secondary | ICD-10-CM | POA: Insufficient documentation

## 2022-05-10 DIAGNOSIS — Z01419 Encounter for gynecological examination (general) (routine) without abnormal findings: Secondary | ICD-10-CM | POA: Insufficient documentation

## 2022-05-10 DIAGNOSIS — Z1231 Encounter for screening mammogram for malignant neoplasm of breast: Secondary | ICD-10-CM

## 2022-05-10 DIAGNOSIS — Z1272 Encounter for screening for malignant neoplasm of vagina: Secondary | ICD-10-CM

## 2022-05-10 DIAGNOSIS — Z90710 Acquired absence of both cervix and uterus: Secondary | ICD-10-CM

## 2022-05-10 NOTE — Telephone Encounter (Signed)
Gabrielle Cameron spoke with patient and she declined BCEP program as she said she would not qualify financially.  Patient is scheduled for Friday, 05/13/22 at 7:40am. Check in at 7:20am at [facility].  Reminded no powder, lotions, perfume, deodorant in breast/arm pits areas.  Gabrielle Cameron spoke with patient and informed her.

## 2022-05-10 NOTE — Telephone Encounter (Signed)
I called to schedule patient's screening mammogram.  She has no insurance and the scheduler at [facility] suggested she may want to come through the BCEP program [facility].  She will need to call the program at [phone] and they will take her information.  Once she qualifies they will schedule the appointment for her.  Debarah Crape will call patient and speak with her about this and see if she would like to proceed with this plan.

## 2022-05-10 NOTE — Telephone Encounter (Signed)
[facility] Received: Today Gabrielle Del, MD  P [facility] Triage Breasts normal.  Mammo 11/2020, Left Neg, Rt Dx mammo/US Neg 01/2021.   Normal breast exam today.

## 2022-05-10 NOTE — Progress Notes (Signed)
Gabrielle Cameron [facility] [birth year] [id number]   History:    52 y.o.  G4P4L4  Married. 4 girls.  2 grand-children 11+ and 2 yo.   RP:  Established patient presenting for annual gyn exam    HPI: S/P LAVH/Left Oophorectomy/Bilateral Salpingectomy 11/28/2017.  No pelvic pain. Increased vaginal discharge with irritation and odor.  No pain with intercourse. Pap LGSIL/HPV HR Neg in 04/2021. Colpo VAIN 1 in 05/2021. Pap Neg 12/2021. Pap reflex today. Breasts normal.  Mammo 11/2020, Left Neg, Rt Dx mammo/US Neg 01/2021.  Urine/Bowel movements normal.  Body mass index stable at 30.45. Walking and reducing Cal/Carbs. Health labs with Fam MD. Alen Bleacher 07/2021.   Past medical history,surgical history, family history and social history were all reviewed and documented in the EPIC chart.  Gynecologic History Patient's last menstrual period was 11/04/2017 (approximate).  Obstetric History OB History  Gravida Para Term Preterm AB Living  SAB IAB Ectopic Multiple Live Births          4    # Outcome Date GA Lbr Len/2nd Weight Sex Delivery Anes PTL Lv  4 Term     F Vag-Spont  N LIV  3 Term     F Vag-Spont  N LIV  2 Term     F Vag-Spont  N LIV  1 Term     F Vag-Spont  N LIV     ROS: A ROS was performed and pertinent positives and negatives are included in the history. GENERAL: No fevers or chills. HEENT: No change in vision, no earache, sore throat or sinus congestion. NECK: No pain or stiffness. CARDIOVASCULAR: No chest pain or pressure. No palpitations. PULMONARY: No shortness of breath, cough or wheeze. GASTROINTESTINAL: No abdominal pain, nausea, vomiting or diarrhea, melena or bright red blood per rectum. GENITOURINARY: No urinary frequency, urgency, hesitancy or dysuria. MUSCULOSKELETAL: No joint or muscle pain, no back pain, no recent trauma. DERMATOLOGIC: No rash, no itching, no lesions. ENDOCRINE: No polyuria, polydipsia, no heat or cold intolerance. No recent change in weight.  HEMATOLOGICAL: No anemia or easy bruising or bleeding. NEUROLOGIC: No headache, seizures, numbness, tingling or weakness. PSYCHIATRIC: No depression, no loss of interest in normal activity or change in sleep pattern.     Exam:   BP 114/72   Pulse 98   Ht 5' 3.75" (1.619 m)   Wt 176 lb (79.8 kg)   LMP 11/04/2017 (Approximate) Comment: sexually active  SpO2 98%   BMI 30.45 kg/m   Body mass index is 30.45 kg/m.  General appearance : Well developed well nourished female. No acute distress HEENT: Eyes: no retinal hemorrhage or exudates,  Neck supple, trachea midline, no carotid bruits, no thyroidmegaly Lungs: Clear to auscultation, no rhonchi or wheezes, or rib retractions  Heart: Regular rate and rhythm, no murmurs or gallops Breast:Examined in sitting and supine position were symmetrical in appearance, no palpable masses or tenderness,  no skin retraction, no nipple inversion, no nipple discharge, no skin discoloration, no axillary or supraclavicular lymphadenopathy Abdomen: no palpable masses or tenderness, no rebound or guarding Extremities: no edema or skin discoloration or tenderness  Pelvic: Vulva: Normal             Vagina: No gross lesions or discharge.  Pap reflex done.  Cervix/Uterus absent  Adnexa  Without masses or tenderness  Anus: Normal   Assessment/Plan:  52 y.o. female for annual exam   1. Encounter for Papanicolaou smear of vagina  as part of routine gynecological examination S/P LAVH/Left Oophorectomy/Bilateral Salpingectomy 11/28/2017.  No pelvic pain. Increased vaginal discharge with irritation and odor.  No pain with intercourse. Pap LGSIL/HPV HR Neg in 04/2021. Colpo VAIN 1 in 05/2021. Pap Neg 12/2021. Pap reflex today. Breasts normal.  Mammo 11/2020, Left Neg, Rt Dx mammo/US Neg 01/2021.  Urine/Bowel movements normal.  Body mass index stable at 30.45. Walking and reducing Cal/Carbs. Health labs with Fam MD. Alen Bleacher 07/2021. - Cytology - PAP( [place])  2. VAIN I  (vaginal intraepithelial neoplasia grade I) - Cytology - PAP( [place])  3. S/P laparoscopic assisted vaginal hysterectomy (LAVH)  Other orders - clonazePAM (KLONOPIN) 2 MG tablet; Take 2 mg by mouth as needed for anxiety. - VITAMIN D PO; Take by mouth.   Genia Del MD, 8:36 AM

## 2022-05-12 LAB — CYTOLOGY - PAP: Diagnosis: NEGATIVE

## 2022-05-13 ENCOUNTER — Ambulatory Visit
Admission: RE | Admit: 2022-05-13 | Discharge: 2022-05-13 | Disposition: A | Discharge status: Home / Self Care | Payer: No Typology Code available for payment source | Source: Ambulatory Visit | Attending: Obstetrics & Gynecology | Admitting: Obstetrics & Gynecology

## 2022-05-13 ENCOUNTER — Other Ambulatory Visit: Payer: Self-pay | Admitting: *Deleted

## 2022-05-13 DIAGNOSIS — Z1231 Encounter for screening mammogram for malignant neoplasm of breast: Secondary | ICD-10-CM

## 2022-05-13 MED ORDER — TINIDAZOLE 500 MG PO TABS: 1.0000 g | ORAL_TABLET | Freq: Every day | ORAL | 0 refills | Status: AC

## 2022-08-22 ENCOUNTER — Ambulatory Visit: Payer: No Typology Code available for payment source | Admitting: Emergency Medicine

## 2022-12-19 ENCOUNTER — Ambulatory Visit (INDEPENDENT_AMBULATORY_CARE_PROVIDER_SITE_OTHER): Payer: Self-pay | Admitting: Emergency Medicine

## 2022-12-19 ENCOUNTER — Encounter: Payer: Self-pay | Admitting: Emergency Medicine

## 2022-12-19 VITALS — BP 130/88 | HR 82 | Temp 98.3°F | Ht 63.5 in | Wt 187.4 lb

## 2022-12-19 DIAGNOSIS — Z23 Encounter for immunization: Secondary | ICD-10-CM

## 2022-12-19 DIAGNOSIS — Z7689 Persons encountering health services in other specified circumstances: Secondary | ICD-10-CM

## 2022-12-19 DIAGNOSIS — Z1329 Encounter for screening for other suspected endocrine disorder: Secondary | ICD-10-CM

## 2022-12-19 DIAGNOSIS — F5104 Psychophysiologic insomnia: Secondary | ICD-10-CM

## 2022-12-19 DIAGNOSIS — Z13228 Encounter for screening for other metabolic disorders: Secondary | ICD-10-CM

## 2022-12-19 DIAGNOSIS — Z0000 Encounter for general adult medical examination without abnormal findings: Secondary | ICD-10-CM

## 2022-12-19 DIAGNOSIS — Z0001 Encounter for general adult medical examination with abnormal findings: Secondary | ICD-10-CM

## 2022-12-19 DIAGNOSIS — Z13220 Encounter for screening for lipoid disorders: Secondary | ICD-10-CM

## 2022-12-19 DIAGNOSIS — Z130 Encounter for screening for diseases of the blood and blood-forming organs and certain disorders involving the immune mechanism: Secondary | ICD-10-CM

## 2022-12-19 LAB — CBC WITH DIFFERENTIAL/PLATELET
Basophils Absolute: 0 10*3/uL (ref 0.0–0.1)
Basophils Relative: 0.3 % (ref 0.0–3.0)
Eosinophils Absolute: 0.1 10*3/uL (ref 0.0–0.7)
Eosinophils Relative: 1.1 % (ref 0.0–5.0)
HCT: 39.6 % (ref 36.0–46.0)
Hemoglobin: 13.5 g/dL (ref 12.0–15.0)
Lymphocytes Relative: 42.7 % (ref 12.0–46.0)
Lymphs Abs: 2.9 10*3/uL (ref 0.7–4.0)
MCHC: 34.2 g/dL (ref 30.0–36.0)
MCV: 81.1 fL (ref 78.0–100.0)
Monocytes Absolute: 0.6 10*3/uL (ref 0.1–1.0)
Monocytes Relative: 9 % (ref 3.0–12.0)
Neutro Abs: 3.1 10*3/uL (ref 1.4–7.7)
Neutrophils Relative %: 46.9 % (ref 43.0–77.0)
Platelets: 274 10*3/uL (ref 150.0–400.0)
RBC: 4.88 Mil/uL (ref 3.87–5.11)
RDW: 14.1 % (ref 11.5–15.5)
WBC: 6.7 10*3/uL (ref 4.0–10.5)

## 2022-12-19 LAB — LIPID PANEL
Cholesterol: 233 mg/dL — ABNORMAL HIGH (ref 0–200)
HDL: 48 mg/dL (ref >39.00)
LDL Cholesterol: 147 mg/dL — ABNORMAL HIGH (ref 0–99)
NonHDL: 184.94
Total CHOL/HDL Ratio: 5
Triglycerides: 191 mg/dL — ABNORMAL HIGH (ref 0.0–149.0)
VLDL: 38.2 mg/dL (ref 0.0–40.0)

## 2022-12-19 LAB — COMPREHENSIVE METABOLIC PANEL
ALT: 16 U/L (ref 0–35)
AST: 21 U/L (ref 0–37)
Albumin: 4.5 g/dL (ref 3.5–5.2)
Alkaline Phosphatase: 65 U/L (ref 39–117)
BUN: 8 mg/dL (ref 6–23)
CO2: 27 meq/L (ref 19–32)
Calcium: 9.4 mg/dL (ref 8.4–10.5)
Chloride: 101 meq/L (ref 96–112)
Creatinine, Ser: 0.62 mg/dL (ref 0.40–1.20)
GFR: 102.73 mL/min (ref >60.00)
Glucose, Bld: 95 mg/dL (ref 70–99)
Potassium: 4.1 meq/L (ref 3.5–5.1)
Sodium: 137 meq/L (ref 135–145)
Total Bilirubin: 0.8 mg/dL (ref 0.2–1.2)
Total Protein: 7.2 g/dL (ref 6.0–8.3)

## 2022-12-19 LAB — TSH: TSH: 2.75 u[IU]/mL (ref 0.35–5.50)

## 2022-12-19 LAB — VITAMIN D 25 HYDROXY (VIT D DEFICIENCY, FRACTURES): VITD: 37.4 ng/mL (ref 30.00–100.00)

## 2022-12-19 LAB — HEMOGLOBIN A1C: Hgb A1c MFr Bld: 5.7 % (ref 4.6–6.5)

## 2022-12-19 LAB — VITAMIN B12: Vitamin B-12: >1537 pg/mL — ABNORMAL HIGH (ref 211–911)

## 2022-12-19 MED ORDER — ZOLPIDEM TARTRATE 10 MG PO TABS
10.0000 mg | ORAL_TABLET | Freq: Every evening | ORAL | 1 refills | Status: AC | PRN
Start: 2022-12-19 — End: 2023-01-18

## 2022-12-19 NOTE — Progress Notes (Signed)
Gabrielle Cameron 52 y.o.   Chief Complaint  Patient presents with   Establish Care    Patient here to establish care. Patient states she doesnt like her PCP now. Pt states she's been having issues sleeping for the past couple of months.     HISTORY OF PRESENT ILLNESS: This is a 52 y.o. female first visit to this office here to establish care with me. Healthy female with a healthy lifestyle. Complaining of chronic insomnia  HPI   Prior to Admission medications   Medication Sig Start Date End Date Taking? Authorizing Provider  cetirizine (ZYRTEC) 10 MG tablet Take 10 mg by mouth daily.   Yes [provider]  MAGNESIUM PO Take by mouth.   Yes [provider]  metroNIDAZOLE (METROCREAM) 0.75 % cream SMARTSIG:Sparingly Topical Twice Daily 12/20/21  Yes [provider]  POTASSIUM PO Take by mouth.   Yes [provider]  VITAMIN D PO Take by mouth.   Yes [provider]  clonazePAM (KLONOPIN) 2 MG tablet Take 2 mg by mouth as needed for anxiety. 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   Hypercholesterolemia 06/23/2011   Family history of diabetes mellitus 06/23/2011    Past Medical History:  Diagnosis Date   Abnormal Pap smear of cervix 2023   vaginal lgsil   High risk HPV infection    Hyperlipidemia    Osteoarthritis    Vitamin D deficiency     Past Surgical History:  Procedure Laterality Date   ENDOMETRIAL ABLATION  12/08/2006   HER OPTION   LAPAROSCOPIC VAGINAL HYSTERECTOMY WITH SALPINGECTOMY Bilateral 11/28/2017   Procedure: LAPAROSCOPIC ASSISTED VAGINAL HYSTERECTOMY WITH SALPINGECTOMY AND LEFT OOPHERECTOMY;  Surgeon: Genia Del, MD;  Location[facility] ;  Service: Gynecology;  Laterality: Bilateral;   MOUTH SURGERY      Social History   Socioeconomic History   Marital status: Married    Spouse name: Not on file   Number of children: Not on file   Years of education: Not on file   Highest education level: Not on file  Occupational History   Not on file  Tobacco Use   Smoking status: Never   Smokeless tobacco: Never  Vaping Use   Vaping status: Never Used  Substance and Sexual Activity   Alcohol use: Not Currently   Drug use: No   Sexual activity: Yes    Partners: Male    Birth control/protection: Surgical, Other-see comments    Comment: PATIENT'S HUSBAND WITH VASECTOMY, hysterectomy  Other Topics Concern   Not on file  Social History Narrative   Not on file   Social Determinants of Health   Financial Resource Strain: Not on file  Food Insecurity: Not on file  Transportation Needs: Not on file  Physical Activity: Not on file  Stress: Not on file  Social Connections: Not on file  Intimate Partner Violence: Not on file    Family History  Problem Relation Age of Onset   Hypertension Mother    Diabetes Father      Review of Systems  Constitutional: Negative.  Negative for chills and fever.  HENT: Negative.  Negative for congestion and sore throat.  Respiratory: Negative.  Negative for cough and shortness of breath.   Cardiovascular: Negative.  Negative for chest pain and palpitations.  Gastrointestinal:  Negative for abdominal pain, constipation, diarrhea, nausea and vomiting.  Skin: Negative.  Negative for rash.  Neurological: Negative.  Negative for dizziness and headaches.  Psychiatric/Behavioral:  The patient has insomnia.   All other systems reviewed and are negative.   Vitals:   12/19/22 0813  BP: 130/88  Pulse: 82  Temp: 98.3 F (36.8 C)  SpO2: 96%    Physical Exam Vitals reviewed.  Constitutional:      Appearance: Normal appearance.   HENT:     Head: Normocephalic.     Mouth/Throat:     Mouth: Mucous membranes are moist.     Pharynx: Oropharynx is clear.  Eyes:     Extraocular Movements: Extraocular movements intact.     Conjunctiva/sclera: Conjunctivae normal.     Pupils: Pupils are equal, round, and reactive to light.  Cardiovascular:     Rate and Rhythm: Normal rate and regular rhythm.     Pulses: Normal pulses.     Heart sounds: Normal heart sounds.  Pulmonary:     Effort: Pulmonary effort is normal.     Breath sounds: Normal breath sounds.  Musculoskeletal:     Cervical back: No tenderness.  Lymphadenopathy:     Cervical: No cervical adenopathy.  Skin:    General: Skin is warm and dry.  Neurological:     General: No focal deficit present.     Mental Status: She is alert and oriented to person, place, and time.  Psychiatric:        Mood and Affect: Mood normal.        Behavior: Behavior normal.      ASSESSMENT & PLAN: Problem List Items Addressed This Visit       Other   Chronic insomnia    Active and affecting quality of life. Tried over-the-counter options without success including melatonin Recommend Ambien 10 mg at bedtime Sleep hygiene discussed with patient      Relevant Medications   zolpidem (AMBIEN) 10 MG tablet   Other Visit Diagnoses     Encounter for general adult medical examination with abnormal findings    -  Primary   Relevant Orders   CBC with Differential   Comprehensive metabolic panel   Hemoglobin A1c   Lipid panel   TSH   Need for immunization against influenza       Relevant Orders   Flu vaccine trivalent PF, 6mos and older(Flulaval,Afluria,Fluarix,Fluzone)   Encounter to establish care       Screening for deficiency anemia       Relevant Orders   CBC with Differential   Screening for lipoid disorders       Relevant Orders   Lipid panel   Screening for endocrine, metabolic and immunity disorder       Relevant Orders   Comprehensive metabolic panel    Hemoglobin A1c   TSH   Vitamin B12   VITAMIN D 25 Hydroxy (Vit-D Deficiency, Fractures)      Modifiable risk factors discussed with patient. Anticipatory guidance according to age provided. The following topics were also discussed: Social Determinants of Health Smoking.  Non-smoker Diet and nutrition Benefits of exercise Cancer family history review Vaccinations review and recommendations Cardiovascular risk assessment and need for blood work Mental health including depression and anxiety Fall and accident prevention  Patient Instructions  Mantenimiento de Radiographer, therapeutic en las mujeres  Health Maintenance, Female Adoptar un estilo de vida saludable y recibir atencin preventiva son importantes para promover la salud y Counsellor. Consulte al mdico sobre: El esquema adecuado para hacerse pruebas y exmenes peridicos. Cosas que puede hacer por su cuenta para prevenir enfermedades y [place] sano. Qu debo saber sobre la dieta, el peso y el ejercicio? Consuma una dieta saludable  Consuma una dieta que incluya muchas verduras, frutas, productos lcteos con bajo contenido de [place] y Associate Professor. No consuma muchos alimentos ricos en grasas slidas, azcares agregados o sodio. Mantenga un peso saludable El ndice de masa muscular [facility]) se [place] para identificar problemas de [place]. Proporciona una estimacin de la grasa corporal basndose en el peso y la altura. Su mdico puede ayudarle a Engineer, site IMC y a Personnel officer o Pharmacologist un peso saludable. Haga ejercicio con regularidad Haga ejercicio con regularidad. Esta es una de las prcticas ms importantes que puede hacer por su salud. La [employer] de los adultos deben seguir estas pautas: Education officer, environmental, al menos, 150 minutos de actividad fsica por semana. El ejercicio debe aumentar la frecuencia cardaca y Media planner transpirar (ejercicio de intensidad moderada). Hacer ejercicios de fortalecimiento por lo [employer] por semana. Agregue  esto a su plan de ejercicio de intensidad moderada. Pase menos tiempo sentada. Incluso la actividad fsica ligera puede ser beneficiosa. Controle sus niveles de colesterol y lpidos en la sangre Comience a realizarse anlisis de lpidos y Oncologist en la sangre a los 20 aos y luego reptalos cada 5 aos. Hgase controlar los niveles de colesterol con mayor frecuencia si: Sus niveles de lpidos y colesterol son altos. Es mayor de 40 aos. Presenta un alto riesgo de padecer enfermedades cardacas. Qu debo saber sobre las pruebas de deteccin del cncer? Segn su historia clnica y sus antecedentes familiares, es posible que deba realizarse pruebas de deteccin del cncer en diferentes edades. Esto puede incluir pruebas de deteccin de lo siguiente: Cncer de mama. Cncer de cuello uterino. Cncer colorrectal. Cncer de piel. Cncer de pulmn. Qu debo saber sobre la enfermedad cardaca, la diabetes y la hipertensin arterial? Presin arterial y enfermedad cardaca La hipertensin arterial causa enfermedades cardacas y [place] el riesgo de accidente cerebrovascular. Es ms probable que esto se manifieste en las personas que tienen lecturas de presin arterial alta o tienen sobrepeso. Hgase controlar la presin arterial: Cada 3 a 5 aos si tiene entre 18 y 58 aos. Todos los aos si es mayor de 40 aos. Diabetes Realcese exmenes de deteccin de la diabetes con regularidad. Este anlisis revisa el nivel de azcar en la sangre en [place]. Hgase las pruebas de deteccin: Cada tres aos despus de los 40 aos de edad si tiene un peso normal y un bajo riesgo de padecer diabetes. Con ms frecuencia y a partir de [place] edad inferior si tiene sobrepeso o un alto riesgo de padecer diabetes. Qu debo saber sobre la prevencin de infecciones? Hepatitis B Si tiene un riesgo ms alto de contraer hepatitis B, debe someterse a un examen de deteccin de este virus. Hable con el mdico para averiguar  si tiene riesgo de contraer la infeccin por hepatitis B. Hepatitis C Se recomienda el anlisis a: [employer] 1945 y 1965. Todas las personas que tengan un riesgo de haber contrado hepatitis C. Enfermedades de transmisin sexual (ETS) Hgase las pruebas de Airline pilot de ITS, incluidas la gonorrea y la clamidia, si: Es sexualmente activa y es menor de [address]. Es mayor de [address],  y el mdico le informa que corre riesgo de tener este tipo de infecciones. La actividad sexual ha cambiado desde que le hicieron la ltima prueba de deteccin y tiene un riesgo mayor de Warehouse manager clamidia o Copy. Pregntele al mdico si usted tiene riesgo. Pregntele al mdico si usted tiene un alto riesgo de Primary school teacher VIH. El mdico tambin puede recomendarle un medicamento recetado para ayudar a evitar la infeccin por el VIH. Si elige tomar medicamentos para prevenir el VIH, primero debe [employer] de deteccin del VIH. Luego debe hacerse anlisis cada 3 meses mientras est tomando los medicamentos. Embarazo Si est por dejar de Armed forces training and education officer (fase premenopusica) y usted puede quedar [place], busque asesoramiento antes de [place]. [place] de 400 a 800 microgramos (mcg) de cido [employer] si [place]. Pida mtodos de control de la natalidad (anticonceptivos) si desea evitar un embarazo no deseado. Osteoporosis y [place] La osteoporosis es una enfermedad en la que los huesos pierden los minerales y la fuerza por el avance de la edad. El resultado pueden ser fracturas en los [place]. Si tiene 65 aos o ms, o si est en riesgo de sufrir osteoporosis y fracturas, pregunte a su mdico si debe: Hacerse pruebas de deteccin de prdida sea. Tomar un suplemento de calcio o de vitamina D para reducir el riesgo de fracturas. Recibir terapia de reemplazo hormonal (TRH) para tratar los sntomas de la menopausia. Siga estas indicaciones en su casa: Consumo de alcohol No  beba alcohol si: Su mdico le indica no hacerlo. Est embarazada, puede estar embarazada o est tratando de [place]. Si bebe alcohol: Limite la cantidad que bebe a lo siguiente: De 0 a 1 bebida por da. Sepa cunta cantidad de alcohol hay en las bebidas que toma. En los [address], una medida equivale a una botella de cerveza de 12 oz (355 ml), un vaso de vino de 5 oz (148 ml) o un vaso de una bebida alcohlica de alta graduacin de 1 oz (44 ml). Estilo de vida No consuma ningn producto que contenga nicotina o tabaco. Estos productos incluyen cigarrillos, tabaco para Theatre manager y aparatos de vapeo, como los Administrator, Civil Service. Si necesita ayuda para dejar de consumir estos productos, consulte al mdico. No consuma drogas. No comparta agujas. Solicite ayuda a su mdico si necesita apoyo o informacin para abandonar las drogas. Indicaciones generales Realcese los estudios de rutina de [address], dentales y de Wellsite geologist. Mantngase al da con las vacunas. Infrmele a su mdico si: Se siente deprimida con frecuencia. Alguna vez ha sido vctima de [place] o no se siente seguro en su casa. Resumen Adoptar un estilo de vida saludable y recibir atencin preventiva son importantes para promover la salud y Counsellor. Siga las instrucciones del mdico acerca de una dieta saludable, el ejercicio y la realizacin de pruebas o exmenes para Hotel manager. Siga las instrucciones del mdico con respecto al control del colesterol y la presin arterial. Esta informacin no tiene Theme park manager el consejo del mdico. Asegrese de hacerle al mdico cualquier pregunta que tenga. Document Revised: 06/18/2020 Document Reviewed: 06/18/2020 Elsevier Patient Education  2024 Elsevier Inc.       Edwina Barth, MD [facility]

## 2022-12-19 NOTE — Assessment & Plan Note (Signed)
Active and affecting quality of life. Tried over-the-counter options without success including melatonin Recommend Ambien 10 mg at bedtime Sleep hygiene discussed with patient

## 2022-12-19 NOTE — Patient Instructions (Signed)
Mantenimiento de Radiographer, therapeutic en las mujeres Health Maintenance, Female Adoptar un estilo de vida saludable y recibir atencin preventiva son importantes para promover la salud y Counsellor. Consulte al mdico sobre: El esquema adecuado para hacerse pruebas y exmenes peridicos. Cosas que puede hacer por su cuenta para prevenir enfermedades y Poplar sano. Qu debo saber sobre la dieta, el peso y el ejercicio? Consuma una dieta saludable  Consuma una dieta que incluya muchas verduras, frutas, productos lcteos con bajo contenido de [place] y Associate Professor. No consuma muchos alimentos ricos en grasas slidas, azcares agregados o sodio. Mantenga un peso saludable El ndice de masa muscular [facility]) se [place] para identificar problemas de [place]. Proporciona una estimacin de la grasa corporal basndose en el peso y la altura. Su mdico puede ayudarle a Engineer, site IMC y a Personnel officer o Pharmacologist un peso saludable. Haga ejercicio con regularidad Haga ejercicio con regularidad. Esta es una de las prcticas ms importantes que puede hacer por su salud. La mayora de los adultos deben seguir estas pautas: Education officer, environmental, al menos, de actividad fsica por semana. El ejercicio debe aumentar la frecuencia cardaca y Media planner transpirar (ejercicio de intensidad moderada). Hacer ejercicios de fortalecimiento por lo [employer] por semana. Agregue esto a su plan de ejercicio de intensidad moderada. Pase menos tiempo sentada. Incluso la actividad fsica ligera puede ser beneficiosa. Controle sus niveles de colesterol y lpidos en la sangre Comience a realizarse anlisis de lpidos y Oncologist en la sangre a los 20aos y luego reptalos cada 5aos. Hgase controlar los niveles de colesterol con mayor frecuencia si: Sus niveles de lpidos y colesterol son altos. Es mayor de 40aos. Presenta un alto riesgo de padecer enfermedades cardacas. Qu debo saber sobre las pruebas de deteccin del  cncer? Segn su historia clnica y sus antecedentes familiares, es posible que deba realizarse pruebas de deteccin del cncer en diferentes edades. Esto puede incluir pruebas de deteccin de lo siguiente: Cncer de mama. Cncer de cuello uterino. Cncer colorrectal. Cncer de piel. Cncer de pulmn. Qu debo saber sobre la enfermedad cardaca, la diabetes y la hipertensin arterial? Presin arterial y enfermedad cardaca La hipertensin arterial causa enfermedades cardacas y [place] el riesgo de accidente cerebrovascular. Es ms probable que esto se manifieste en las personas que tienen lecturas de presin arterial alta o tienen sobrepeso. Hgase controlar la presin arterial: Cada 3 a 5 aos si tiene entre 18 y 23 aos. Todos los aos si es mayor de [place]. Diabetes Realcese exmenes de deteccin de la diabetes con regularidad. Este anlisis revisa el nivel de azcar en la sangre en [place]. Hgase las pruebas de deteccin: Cada tresaos despus de los 40aos de edad si tiene un peso normal y un bajo riesgo de padecer diabetes. Con ms frecuencia y a partir de [place] edad inferior si tiene sobrepeso o un alto riesgo de padecer diabetes. Qu debo saber sobre la prevencin de infecciones? Hepatitis B Si tiene un riesgo ms alto de contraer hepatitis B, debe someterse a un examen de deteccin de este virus. Hable con el mdico para averiguar si tiene riesgo de contraer la infeccin por hepatitis B. Hepatitis C Se recomienda el anlisis a: [employer] 1945 y 1965. Todas las personas que tengan un riesgo de haber contrado hepatitis C. Enfermedades de transmisin sexual (ETS) Hgase las pruebas de Airline pilot de ITS, incluidas la gonorrea y la clamidia, si: Es sexualmente activa y es menor de [place]. Es mayor de 24aos, y Film/video editor  le informa que corre riesgo de tener este tipo de infecciones. La actividad sexual ha cambiado desde que le hicieron la ltima prueba de  deteccin y tiene un riesgo mayor de Warehouse manager clamidia o Copy. Pregntele al mdico si usted tiene riesgo. Pregntele al mdico si usted tiene un alto riesgo de Primary school teacher VIH. El mdico tambin puede recomendarle un medicamento recetado para ayudar a evitar la infeccin por el VIH. Si elige tomar medicamentos para prevenir el VIH, primero debe [employer] de deteccin del VIH. Luego debe hacerse anlisis cada mientras est tomando los medicamentos. Embarazo Si est por dejar de Armed forces training and education officer (fase premenopusica) y usted puede quedar [place], busque asesoramiento antes de [place]. Tome de 400 a (mcg) de cido [employer] si [place]. Pida mtodos de control de la natalidad (anticonceptivos) si desea evitar un embarazo no deseado. Osteoporosis y [place] La osteoporosis es una enfermedad en la que los huesos pierden los minerales y la fuerza por el avance de la edad. El resultado pueden ser fracturas en los [place]. Si tiene 65aos o ms, o si est en riesgo de sufrir osteoporosis y fracturas, pregunte a su mdico si debe: Hacerse pruebas de deteccin de prdida sea. Tomar un suplemento de calcio o de vitamina D para reducir el riesgo de fracturas. Recibir terapia de reemplazo hormonal (TRH) para tratar los sntomas de la menopausia. Siga estas indicaciones en su casa: Consumo de alcohol No beba alcohol si: Su mdico le indica no hacerlo. Est embarazada, puede estar embarazada o est tratando de [place]. Si bebe alcohol: Limite la cantidad que bebe a lo siguiente: De 0 a 1 bebida por da. Sepa cunta cantidad de alcohol hay en las bebidas que toma. En los [place], una medida equivale a una botella de cerveza de 12oz ( ), un vaso de vino de 5oz ( ) o un vaso de una bebida alcohlica de alta graduacin de 1oz (44ml). Estilo de vida No consuma ningn producto que contenga nicotina o tabaco. Estos  productos incluyen cigarrillos, tabaco para Theatre manager y aparatos de vapeo, como los Administrator, Civil Service. Si necesita ayuda para dejar de consumir estos productos, consulte al mdico. No consuma drogas. No comparta agujas. Solicite ayuda a su mdico si necesita apoyo o informacin para abandonar las drogas. Indicaciones generales Realcese los estudios de rutina de [address], dentales y de Wellsite geologist. Mantngase al da con las vacunas. Infrmele a su mdico si: Se siente deprimida con frecuencia. Alguna vez ha sido vctima de [place] o no se siente seguro en su casa. Resumen Adoptar un estilo de vida saludable y recibir atencin preventiva son importantes para promover la salud y Counsellor. Siga las instrucciones del mdico acerca de una dieta saludable, el ejercicio y la realizacin de pruebas o exmenes para Hotel manager. Siga las instrucciones del mdico con respecto al control del colesterol y la presin arterial. Esta informacin no tiene Theme park manager el consejo del mdico. Asegrese de hacerle al mdico cualquier pregunta que tenga. Document Revised: 06/18/2020 Document Reviewed: 06/18/2020 Elsevier Patient Education  2024 [employer].

## 2023-06-30 ENCOUNTER — Ambulatory Visit: Payer: Self-pay | Admitting: Family Medicine

## 2023-06-30 VITALS — BP 146/100 | HR 81 | Temp 98.0°F | Ht 63.0 in | Wt 190.0 lb

## 2023-06-30 DIAGNOSIS — B9689 Other specified bacterial agents as the cause of diseases classified elsewhere: Secondary | ICD-10-CM

## 2023-06-30 DIAGNOSIS — J329 Chronic sinusitis, unspecified: Secondary | ICD-10-CM

## 2023-06-30 DIAGNOSIS — R051 Acute cough: Secondary | ICD-10-CM

## 2023-06-30 MED ORDER — AZITHROMYCIN 250 MG PO TABS: ORAL_TABLET | ORAL | 0 refills | Status: AC

## 2023-06-30 MED ORDER — HYDROCODONE BIT-HOMATROP MBR 5-1.5 MG/5ML PO SOLN: 5.0000 mL | Freq: Three times a day (TID) | ORAL | 0 refills | Status: AC | PRN

## 2023-06-30 NOTE — Progress Notes (Signed)
 Acute Office Visit  Subjective:     Patient ID: Gabrielle Cameron, female    DOB: 09-05-1970, 53 y.o.   MRN: [id number]  Chief Complaint  Patient presents with   Acute Visit    Cough since Monday congested with green mucus been taking sudafed for 2 days. Had a fever only on Tuesday. Was in [place] for 3 months    HPI Patient is in today for complaints of productive cough, postnasal drip, intermittent headaches, and pleuritic chest and back pain with deep breaths and coughing. Patient reports that symptoms began on Monday. She states that she has blood-tinged green phlegm. Symptoms worsened on Tuesday with a subjective fever but feels she has been improving since then. Reports symptoms have been well-managed with Tylenol , Motrin , and Sudafed. She denies shortness of breath, ear pain, or throat pain.   ROS  See HPI    Objective:    BP (!) 146/100 (BP Location: Left Arm, Patient Position: Sitting)   Pulse 81   Temp 98 F (36.7 C) (Temporal)   Ht 5\' 3"  (1.6 m)   Wt 190 lb (86.2 kg)   LMP 11/04/2017 (Approximate) Comment: sexually active  SpO2 97%   BMI 33.66 kg/m    Physical Exam Vitals and nursing note reviewed.  Constitutional:      Appearance: Normal appearance.  HENT:     Head: Normocephalic and atraumatic.     Right Ear: Tympanic membrane normal.     Left Ear: Tympanic membrane normal.     Nose: No congestion or rhinorrhea.     Right Sinus: Maxillary sinus tenderness and frontal sinus tenderness present.     Left Sinus: Maxillary sinus tenderness and frontal sinus tenderness present.     Mouth/Throat:     Mouth: Mucous membranes are moist.     Pharynx: Oropharynx is clear. No oropharyngeal exudate or posterior oropharyngeal erythema.  Eyes:     Conjunctiva/sclera: Conjunctivae normal.     Pupils: Pupils are equal, round, and reactive to light.  Cardiovascular:     Rate and Rhythm: Normal rate and regular rhythm.     Pulses: Normal pulses.     Heart  sounds: Normal heart sounds.  Pulmonary:     Effort: Pulmonary effort is normal.     Breath sounds: Rhonchi present.     Comments: Right lower lobe Musculoskeletal:     Cervical back: Normal range of motion.  Skin:    General: Skin is warm and dry.     Capillary Refill: Capillary refill takes less than 2 seconds.  Neurological:     Mental Status: She is alert and oriented to person, place, and time.  Psychiatric:        Mood and Affect: Mood normal.     No results found for any visits on 06/30/23.      Assessment & Plan:   Problem List Items Addressed This Visit   None Visit Diagnoses       Bacterial sinusitis    -  Primary   Relevant Medications   azithromycin  (ZITHROMAX ) 250 MG tablet   HYDROcodone  bit-homatropine (HYCODAN) 5-1.5 MG/5ML syrup     Acute cough       Relevant Medications   HYDROcodone  bit-homatropine (HYCODAN) 5-1.5 MG/5ML syrup       Meds ordered this encounter  Medications   azithromycin  (ZITHROMAX ) 250 MG tablet    Sig: Take 2 tablets on day 1, then 1 tablet daily on days 2 through 5  Dispense:  6 tablet    Refill:  0   HYDROcodone  bit-homatropine (HYCODAN) 5-1.5 MG/5ML syrup    Sig: Take 5 mLs by mouth every 8 (eight) hours as needed.    Dispense:  120 mL    Refill:  0    Return if symptoms worsen or fail to improve.  Wilford Hanks, RN

## 2023-07-02 ENCOUNTER — Encounter: Payer: Self-pay | Admitting: Family Medicine

## 2023-07-02 NOTE — Patient Instructions (Signed)
 I have sent in azithromycin  for you to take.  Take 2 tablets today, then 1 tablet daily for the next 4 days.  I have sent in hydrocodone  cough syrup for you to take 5 mL once daily in the evening as needed for cough.  This medication may make you sleepy.  Do not drive or operate heavy machinery while taking this medication.

## 2023-12-07 NOTE — Progress Notes (Deleted)
 53 y.o. [id number] female here for annual exam. Married. PCP: Purcell Emil Schanz, MD   Patient's last menstrual period was 11/04/2017 (approximate).    She reports ***. Urine sample provided: ***  Abnormal bleeding: *** Pelvic discharge or pain: *** Breast mass, nipple discharge or skin changes : ***  Sexually active: *** Birth control: *** Last PAP:     Component Value Date/Time   DIAGPAP  05/10/2022 0902    - Negative for intraepithelial lesion or malignancy (NILM)   DIAGPAP  12/24/2021 1018    - Negative for intraepithelial lesion or malignancy (NILM)   DIAGPAP - Low grade squamous intraepithelial lesion (LSIL) (A) 05/06/2021 0828   HPVHIGH Negative 06/08/2021 1145   ADEQPAP Satisfactory for evaluation. 05/10/2022 0902   ADEQPAP Satisfactory for evaluation. 12/24/2021 1018   ADEQPAP Satisfactory for evaluation. 05/06/2021 0828   Last mammogram: 05/13/22 birads 1, density B Last colonoscopy: 08/10/21 q10y  Exercising: *** Smoker: ***  Flowsheet Row Office Visit from 12/19/2022 in [facility]  PHQ-2 Total Score 0       GYN HISTORY: ***  OB History  Gravida Para Term Preterm AB Living  4 4 4   4   SAB IAB Ectopic Multiple Live Births      4    # Outcome Date GA Lbr Len/2nd Weight Sex Type Anes PTL Lv  4 Term     F Vag-Spont  N LIV  3 Term     F Vag-Spont  N LIV  2 Term     F Vag-Spont  N LIV  1 Term     F Vag-Spont  N LIV   Past Medical History:  Diagnosis Date   Abnormal Pap smear of cervix 2023   vaginal lgsil   High risk HPV infection    Hyperlipidemia    Osteoarthritis    Vitamin D  deficiency    Past Surgical History:  Procedure Laterality Date   ENDOMETRIAL ABLATION  12/08/2006   HER OPTION   LAPAROSCOPIC VAGINAL HYSTERECTOMY WITH SALPINGECTOMY Bilateral 11/28/2017   Procedure: LAPAROSCOPIC ASSISTED VAGINAL HYSTERECTOMY WITH SALPINGECTOMY AND LEFT OOPHERECTOMY;  Surgeon: Lavoie, Marie-Lyne, MD;  Location:  [facility];  Service: Gynecology;  Laterality: Bilateral;   MOUTH SURGERY     Current Outpatient Medications on File Prior to Visit  Medication Sig Dispense Refill   cetirizine (ZYRTEC) 10 MG tablet Take 10 mg by mouth daily.     clonazePAM (KLONOPIN) 2 MG tablet Take 2 mg by mouth as needed for anxiety.     HYDROcodone  bit-homatropine (HYCODAN) 5-1.5 MG/5ML syrup Take 5 mLs by mouth every 8 (eight) hours as needed. 120 mL 0   MAGNESIUM PO Take by mouth.     metFORMIN (GLUCOPHAGE) 500 MG tablet Take 500 mg by mouth daily.     metroNIDAZOLE  (METROCREAM ) 0.75 % cream SMARTSIG:Sparingly Topical Twice Daily     POTASSIUM PO Take by mouth.     VITAMIN D  PO Take by mouth.     zolpidem  (AMBIEN ) 10 MG tablet Take 1 tablet (10 mg total) by mouth at bedtime as needed for sleep. 15 tablet 1   No current facility-administered medications on file prior to visit.   Social History   Socioeconomic History   Marital status: Married    Spouse name: Not on file   Number of children: Not on file   Years of education: Not on file   Highest education level: 6th grade  Occupational History  Not on file  Tobacco Use   Smoking status: Never   Smokeless tobacco: Never  Vaping Use   Vaping status: Never Used  Substance and Sexual Activity   Alcohol use: Not Currently   Drug use: No   Sexual activity: Yes    Partners: Male    Birth control/protection: Surgical, Other-see comments    Comment: PATIENT'S HUSBAND WITH VASECTOMY, hysterectomy  Other Topics Concern   Not on file  Social History Narrative   Not on file   Social Drivers of Health   Financial Resource Strain: Low Risk  (06/29/2023)   Overall Financial Resource Strain (CARDIA)    Difficulty of Paying Living Expenses: Not hard at all  Food Insecurity: No Food Insecurity (06/29/2023)   Hunger Vital Sign    Worried About Running Out of Food in the Last Year: Never true    Ran Out of Food in the Last Year: Never true   Transportation Needs: No Transportation Needs (06/29/2023)   PRAPARE - Administrator, Civil Service (Medical): No    Lack of Transportation (Non-Medical): No  Physical Activity: Unknown (06/29/2023)   Exercise Vital Sign    Days of Exercise per Week: 0 days    Minutes of Exercise per Session: Not on file  Stress: Stress Concern Present (06/29/2023)   [employer] of Occupational Health - Occupational Stress Questionnaire    Feeling of Stress : To some extent  Social Connections: Moderately Integrated (06/29/2023)   Social Connection and Isolation Panel    Frequency of Communication with Friends and Family: More than three times a week    Frequency of Social Gatherings with Friends and Family: Once a week    Attends Religious Services: 1 to 4 times per year    Active Member of [employer] or Organizations: No    Attends Engineer, Structural: Not on file    Marital Status: Married  Catering Manager Violence: Not on file   Family History  Problem Relation Age of Onset   Hypertension Mother    Diabetes Father    Allergies  Allergen Reactions   Other Nausea And Vomiting    Steroid pills Had burning in stomach     PE There were no vitals filed for this visit. There is no height or weight on file to calculate BMI.  Physical Exam    Assessment and Plan:        There are no diagnoses linked to this encounter. Clotilda FORBES Pa, CMA

## 2023-12-11 ENCOUNTER — Ambulatory Visit: Payer: Self-pay | Admitting: Obstetrics and Gynecology

## 2024-01-12 ENCOUNTER — Ambulatory Visit: Payer: Self-pay | Admitting: Obstetrics and Gynecology

## 2024-01-12 ENCOUNTER — Encounter: Payer: Self-pay | Admitting: Obstetrics and Gynecology

## 2024-01-12 ENCOUNTER — Other Ambulatory Visit (HOSPITAL_COMMUNITY)
Admission: RE | Admit: 2024-01-12 | Discharge: 2024-01-12 | Disposition: A | Payer: Self-pay | Source: Ambulatory Visit | Attending: Obstetrics and Gynecology | Admitting: Obstetrics and Gynecology

## 2024-01-12 VITALS — BP 138/82 | HR 80 | Temp 98.1°F | Ht 64.0 in | Wt 191.0 lb

## 2024-01-12 DIAGNOSIS — Z90710 Acquired absence of both cervix and uterus: Secondary | ICD-10-CM

## 2024-01-12 DIAGNOSIS — Z01419 Encounter for gynecological examination (general) (routine) without abnormal findings: Secondary | ICD-10-CM | POA: Insufficient documentation

## 2024-01-12 DIAGNOSIS — Z1331 Encounter for screening for depression: Secondary | ICD-10-CM

## 2024-01-12 DIAGNOSIS — N890 Mild vaginal dysplasia: Secondary | ICD-10-CM

## 2024-01-12 DIAGNOSIS — N958 Other specified menopausal and perimenopausal disorders: Secondary | ICD-10-CM | POA: Insufficient documentation

## 2024-01-12 MED ORDER — ESTRADIOL 0.01 % VA CREA: TOPICAL_CREAM | VAGINAL | 1 refills | Status: AC

## 2024-01-12 NOTE — Assessment & Plan Note (Signed)
 Cervical cancer screening performed according to ASCCP guidelines. Encouraged annual mammogram screening, information provided Colonoscopy UTD DXA N/A Labs and immunizations with her primary Encouraged safe sexual practices as indicated Encouraged healthy lifestyle practices with diet and exercise For patients under 50-53yo, I recommend 1200mg  calcium  daily and 600IU of vitamin D  daily.

## 2024-01-12 NOTE — Patient Instructions (Addendum)
 For free or low cost mammograms: [facility] at [facility] [address] [place],  [place]  [address] Main: [phone]  Health Maintenance, Female Adopting a healthy lifestyle and getting preventive care are important in promoting health and wellness. Ask your health care provider about: The right schedule for you to have regular tests and exams. Things you can do on your own to prevent diseases and keep yourself healthy. What should I know about diet, weight, and exercise? Eat a healthy diet  Eat a diet that includes plenty of vegetables, fruits, low-fat dairy products, and lean protein. Do not eat a lot of foods that are high in solid fats, added sugars, or sodium. Maintain a healthy weight Body mass index (BMI) is used to identify weight problems. It estimates body fat based on height and weight. Your health care provider can help determine your BMI and help you achieve or maintain a healthy weight. Get regular exercise Get regular exercise. This is one of the most important things you can do for your health. Most adults should: Exercise for at least 150 minutes each week. The exercise should increase your heart rate and make you sweat (moderate-intensity exercise). Do strengthening exercises at least twice a week. This is in addition to the moderate-intensity exercise. Spend less time sitting. Even light physical activity can be beneficial. Watch cholesterol and blood lipids Have your blood tested for lipids and cholesterol at 53 years of age, then have this test every 5 years. Have your cholesterol levels checked more often if: Your lipid or cholesterol levels are high. You are older than 52 years of age. You are at high risk for heart disease. What should I know about cancer screening? Depending on your health history and family history, you may need to have cancer screening at various ages. This may include  screening for: Breast cancer. Cervical cancer. Colorectal cancer. Skin cancer. Lung cancer. What should I know about heart disease, diabetes, and high blood pressure? Blood pressure and heart disease High blood pressure causes heart disease and increases the risk of stroke. This is more likely to develop in people who have high blood pressure readings or are overweight. Have your blood pressure checked: Every 3-5 years if you are 72-36 years of age. Every year if you are 69 years old or older. Diabetes Have regular diabetes screenings. This checks your fasting blood sugar level. Have the screening done: Once every three years after age 72 if you are at a normal weight and have a low risk for diabetes. More often and at a younger age if you are overweight or have a high risk for diabetes. What should I know about preventing infection? Hepatitis B If you have a higher risk for hepatitis B, you should be screened for this virus. Talk with your health care provider to find out if you are at risk for hepatitis B infection. Hepatitis C Testing is recommended for: Everyone born from [birth year] through 1965. Anyone with known risk factors for hepatitis C. Sexually transmitted infections (STIs) Get screened for STIs, including gonorrhea and chlamydia, if: You are sexually active and are younger than 53 years of age. You are older than 53 years of age and your health care provider tells you that you are at risk for this type of infection. Your sexual activity has changed since you were last screened, and you are at increased risk for chlamydia or gonorrhea. Ask your health care provider  if you are at risk. Ask your health care provider about whether you are at high risk for HIV. Your health care provider may recommend a prescription medicine to help prevent HIV infection. If you choose to take medicine to prevent HIV, you should first get tested for HIV. You should then be tested every 3 months for as  long as you are taking the medicine. Pregnancy If you are about to stop having your period (premenopausal) and you may become pregnant, seek counseling before you get pregnant. Take 400 to 800 micrograms (mcg) of folic acid every day if you become pregnant. Ask for birth control (contraception) if you want to prevent pregnancy. Osteoporosis and menopause Osteoporosis is a disease in which the bones lose minerals and strength with aging. This can result in bone fractures. If you are 78 years old or older, or if you are at risk for osteoporosis and fractures, ask your health care provider if you should: Be screened for bone loss. Take a calcium  or vitamin D  supplement to lower your risk of fractures. Be given hormone replacement therapy (HRT) to treat symptoms of menopause. Follow these instructions at home: Alcohol use Do not drink alcohol if: Your health care provider tells you not to drink. You are pregnant, may be pregnant, or are planning to become pregnant. If you drink alcohol: Limit how much you have to: 0-1 drink a day. Know how much alcohol is in your drink. In the [place] one drink equals one 12 oz bottle of beer (355 mL), one 5 oz glass of wine (148 mL), or one 1 oz glass of hard liquor (44 mL). Lifestyle Do not use any products that contain nicotine or tobacco. These products include cigarettes, chewing tobacco, and vaping devices, such as e-cigarettes. If you need help quitting, ask your health care provider. Do not use street drugs. Do not share needles. Ask your health care provider for help if you need support or information about quitting drugs. General instructions Schedule regular health, dental, and eye exams. Stay current with your vaccines. Tell your health care provider if: You often feel depressed. You have ever been abused or do not feel safe at home. Summary Adopting a healthy lifestyle and getting preventive care are important in promoting health and  wellness. Follow your health care provider's instructions about healthy diet, exercising, and getting tested or screened for diseases. Follow your health care provider's instructions on monitoring your cholesterol and blood pressure. This information is not intended to replace advice given to you by your health care provider. Make sure you discuss any questions you have with your health care provider. Document Revised: 06/01/2020 Document Reviewed: 06/01/2020 Elsevier Patient Education  2024 [employer].

## 2024-01-12 NOTE — Progress Notes (Signed)
 "  53 y.o. [id number] female with S/P LAVH/Left Oophorectomy/Bilateral Salpingectomy 11/28/2017, VAIN 1 (2023) here for annual exam. Married. 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 PCP: Purcell Emil Schanz, MD   Patient's last menstrual period was 11/04/2017.   She reports some vaginal dryness. Urine sample provided: none  Abnormal bleeding: none Pelvic discharge or pain: none Breast mass, nipple discharge or skin changes : none  Sexually active: yes  Last PAP:     Component Value Date/Time   DIAGPAP  05/10/2022 0902    - Negative for intraepithelial lesion or malignancy (NILM)   DIAGPAP  12/24/2021 1018    - Negative for intraepithelial lesion or malignancy (NILM)   DIAGPAP - Low grade squamous intraepithelial lesion (LSIL) (A) 05/06/2021 0828   HPVHIGH Negative 06/08/2021 1145   ADEQPAP Satisfactory for evaluation. 05/10/2022 0902   ADEQPAP Satisfactory for evaluation. 12/24/2021 1018   ADEQPAP Satisfactory for evaluation. 05/06/2021 0828   Last mammogram: 05/10/2022 BI-RADS 1 Last colonoscopy: 08/10/2021  Smoker: never  [employer] Visit from 01/12/2024 in [facility]  PHQ-2 Total Score 0      GYN HISTORY: As noted  OB History  Gravida Para Term Preterm AB Living  4 4 4   4   SAB IAB Ectopic Multiple Live Births      4    # Outcome Date GA Lbr Len/2nd Weight Sex Type Anes PTL Lv  4 Term     F Vag-Spont  N LIV  3 Term     F Vag-Spont  N LIV  2 Term     F Vag-Spont  N LIV  1 Term     F Vag-Spont  N LIV   Past Medical History:  Diagnosis Date   Abnormal Pap smear of cervix 2023   vaginal lgsil   High risk HPV infection    Hyperlipidemia    Osteoarthritis    Vitamin D  deficiency    Past Surgical History:  Procedure Laterality Date   ENDOMETRIAL ABLATION  12/08/2006   HER OPTION   LAPAROSCOPIC VAGINAL HYSTERECTOMY WITH SALPINGECTOMY Bilateral 11/28/2017   Procedure: LAPAROSCOPIC ASSISTED VAGINAL HYSTERECTOMY WITH  SALPINGECTOMY AND LEFT OOPHERECTOMY;  Surgeon: Lavoie, Marie-Lyne, MD;  Location: [facility];  Service: Gynecology;  Laterality: Bilateral;   MOUTH SURGERY     Medications Ordered Prior to Encounter[1] Social History   Socioeconomic History   Marital status: Married    Spouse name: Not on file   Number of children: Not on file   Years of education: Not on file   Highest education level: 6th grade  Occupational History   Not on file  Tobacco Use   Smoking status: Never   Smokeless tobacco: Never  Vaping Use   Vaping status: Never Used  Substance and Sexual Activity   Alcohol use: Not Currently   Drug use: No   Sexual activity: Yes    Partners: Male    Birth control/protection: Surgical, Other-see comments    Comment: PATIENT'S HUSBAND WITH VASECTOMY, hysterectomy  Other Topics Concern   Not on file  Social History Narrative   Not on file   Social Drivers of Health   Tobacco Use: Low Risk (01/12/2024)   Patient History    Smoking Tobacco Use: Never    Smokeless Tobacco Use: Never    Passive Exposure: Not on file  Financial Resource Strain: Low Risk (06/29/2023)   Overall Financial Resource Strain (CARDIA)    Difficulty of Paying Living Expenses:  Not hard at all  Food Insecurity: No Food Insecurity (06/29/2023)   Hunger Vital Sign    Worried About Running Out of Food in the Last Year: Never true    Ran Out of Food in the Last Year: Never true  Transportation Needs: No Transportation Needs (06/29/2023)   PRAPARE - Administrator, Civil Service (Medical): No    Lack of Transportation (Non-Medical): No  Physical Activity: Unknown (06/29/2023)   Exercise Vital Sign    Days of Exercise per Week: 0 days    Minutes of Exercise per Session: Not on file  Stress: Stress Concern Present (06/29/2023)   [employer] of Occupational Health - Occupational Stress Questionnaire    Feeling of Stress : To some extent  Social Connections: Moderately Integrated  (06/29/2023)   Social Connection and Isolation Panel    Frequency of Communication with Friends and Family: More than three times a week    Frequency of Social Gatherings with Friends and Family: Once a week    Attends Religious Services: 1 to 4 times per year    Active Member of [employer] or Organizations: No    Attends Engineer, Structural: Not on file    Marital Status: Married  Catering Manager Violence: Not on file  Depression (PHQ2-9): Low Risk (01/12/2024)   Depression (PHQ2-9)    PHQ-2 Score: 0  Alcohol Screen: Not on file  Housing: Low Risk (06/29/2023)   Housing Stability Vital Sign    Unable to Pay for Housing in the Last Year: No    Number of Times Moved in the Last Year: 0    Homeless in the Last Year: No  Utilities: Not on file  Health Literacy: Not on file   Family History  Problem Relation Age of Onset   Hypertension Mother    Diabetes Father    Allergies[2]   PE Today's Vitals   01/12/24 0803  BP: 138/82  Pulse: 80  Temp: 98.1 F (36.7 C)  TempSrc: Oral  SpO2: 97%  Weight: 191 lb (86.6 kg)  Height: 5' 4 (1.626 m)   Body mass index is 32.79 kg/m.  Physical Exam Vitals reviewed. Exam conducted with a chaperone present.  Constitutional:      General: She is not in acute distress.    Appearance: Normal appearance.  HENT:     Head: Normocephalic and atraumatic.     Nose: Nose normal.  Eyes:     Extraocular Movements: Extraocular movements intact.     Conjunctiva/sclera: Conjunctivae normal.  Pulmonary:     Effort: Pulmonary effort is normal.  Chest:     Chest wall: No mass or tenderness.  Breasts:    Right: Normal. No swelling, mass, nipple discharge or tenderness.     Left: Normal. No swelling, mass, nipple discharge or tenderness.  Abdominal:     General: There is no distension.     Palpations: Abdomen is soft.     Tenderness: There is no abdominal tenderness.  Genitourinary:    Exam position: Lithotomy position.     Urethra: No  prolapse.     Vagina: Normal. No vaginal discharge or bleeding.     Cervix: No lesion.     Adnexa: Right adnexa normal and left adnexa normal.     Comments: Cervix and uterus absent VV atrophy Musculoskeletal:        General: Normal range of motion.     Cervical back: Normal range of motion.  Lymphadenopathy:  Upper Body:     Right upper body: No axillary adenopathy.     Left upper body: No axillary adenopathy.     Lower Body: No right inguinal adenopathy. No left inguinal adenopathy.  Skin:    General: Skin is warm and dry.  Neurological:     General: No focal deficit present.     Mental Status: She is alert.  Psychiatric:        Mood and Affect: Mood normal.        Behavior: Behavior normal.       Assessment and Plan:        Well woman exam with routine gynecological exam Assessment & Plan: Cervical cancer screening performed according to ASCCP guidelines. Encouraged annual mammogram screening, information provided Colonoscopy UTD DXA N/A Labs and immunizations with her primary Encouraged safe sexual practices as indicated Encouraged healthy lifestyle practices with diet and exercise For patients under 50-70yo, I recommend 1200mg  calcium  daily and 600IU of vitamin D  daily.    VAIN I (vaginal intraepithelial neoplasia grade I) -     Cytology - PAP  S/P laparoscopic assisted vaginal hysterectomy (LAVH)  Negative depression screening  Genitourinary syndrome of menopause -     Estradiol ; Apply 0.5g to vulva nightly for 2 weeks then 2 times a week. Do not use applicator.  Dispense: 42.5 g; Refill: 1  Recommend starting PV estrace   Vera LULLA Pa, MD     [1]  Current Outpatient Medications on File Prior to Visit  Medication Sig Dispense Refill   cetirizine (ZYRTEC) 10 MG tablet Take 10 mg by mouth daily.     MAGNESIUM PO Take by mouth.     metFORMIN (GLUCOPHAGE) 500 MG tablet Take 500 mg by mouth daily.     metroNIDAZOLE  (METROCREAM ) 0.75 % cream  SMARTSIG:Sparingly Topical Twice Daily     POTASSIUM PO Take by mouth.     VITAMIN D  PO Take by mouth.     No current facility-administered medications on file prior to visit.  [2]  Allergies Allergen Reactions   Other Nausea And Vomiting    Steroid pills Had burning in stomach   "

## 2024-01-19 LAB — CYTOLOGY - PAP
Comment: NEGATIVE
Comment: NEGATIVE
Comment: NEGATIVE
HPV 16: NEGATIVE
HPV 18 / 45: NEGATIVE
High risk HPV: POSITIVE — AB

## 2024-01-22 ENCOUNTER — Ambulatory Visit: Payer: Self-pay | Admitting: Obstetrics and Gynecology

## 2024-01-22 DIAGNOSIS — R87618 Other abnormal cytological findings on specimens from cervix uteri: Secondary | ICD-10-CM

## 2024-01-22 DIAGNOSIS — R87629 Unspecified abnormal cytological findings in specimens from vagina: Secondary | ICD-10-CM

## 2024-01-22 DIAGNOSIS — B977 Papillomavirus as the cause of diseases classified elsewhere: Secondary | ICD-10-CM

## 2024-02-07 ENCOUNTER — Other Ambulatory Visit (HOSPITAL_COMMUNITY)
Admission: RE | Admit: 2024-02-07 | Discharge: 2024-02-07 | Disposition: A | Discharge status: Home / Self Care | Payer: Self-pay | Source: Ambulatory Visit | Attending: Obstetrics and Gynecology | Admitting: Obstetrics and Gynecology

## 2024-02-07 ENCOUNTER — Encounter: Payer: Self-pay | Admitting: Obstetrics and Gynecology

## 2024-02-07 ENCOUNTER — Ambulatory Visit (INDEPENDENT_AMBULATORY_CARE_PROVIDER_SITE_OTHER): Payer: Self-pay | Admitting: Obstetrics and Gynecology

## 2024-02-07 VITALS — BP 142/80 | HR 89 | Temp 98.0°F | Wt 196.0 lb

## 2024-02-07 DIAGNOSIS — Z01812 Encounter for preprocedural laboratory examination: Secondary | ICD-10-CM

## 2024-02-07 DIAGNOSIS — R87629 Unspecified abnormal cytological findings in specimens from vagina: Secondary | ICD-10-CM | POA: Insufficient documentation

## 2024-02-07 DIAGNOSIS — N890 Mild vaginal dysplasia: Secondary | ICD-10-CM

## 2024-02-07 DIAGNOSIS — B977 Papillomavirus as the cause of diseases classified elsewhere: Secondary | ICD-10-CM

## 2024-02-07 LAB — PREGNANCY, URINE: Preg Test, Ur: NEGATIVE

## 2024-02-07 NOTE — Progress Notes (Signed)
 "  54 y.o. [id number] female S/P LAVH/Left Oophorectomy/Bilateral Salpingectomy 11/28/2017 with VAIN 1 (2023)  here for colposcopy. Married  Patient's last menstrual period was 11/04/2017.   Last PAP:    Component Value Date/Time   DIAGPAP - Low grade squamous intraepithelial lesion (LSIL) (A) 01/12/2024 0830   DIAGPAP  05/10/2022 0902    - Negative for intraepithelial lesion or malignancy (NILM)   DIAGPAP  12/24/2021 1018    - Negative for intraepithelial lesion or malignancy (NILM)   HPVHIGH Positive (A) 01/12/2024 0830   HPVHIGH Negative 06/08/2021 1145   ADEQPAP  01/12/2024 0830    Satisfactory for evaluation. The presence or absence of an   ADEQPAP  01/12/2024 0830    endocervical/transformation zone component cannot be determined because   ADEQPAP of atrophy. 01/12/2024 0830    OB History  Gravida Para Term Preterm AB Living  4 4 4   4   SAB IAB Ectopic Multiple Live Births      4    # Outcome Date GA Lbr Len/2nd Weight Sex Type Anes PTL Lv  4 Term     F Vag-Spont  N LIV  3 Term     F Vag-Spont  N LIV  2 Term     F Vag-Spont  N LIV  1 Term     F Vag-Spont  N LIV    Past Medical History:  Diagnosis Date   Abnormal Pap smear of cervix 2023   vaginal lgsil   High risk HPV infection    Hyperlipidemia    Osteoarthritis    Vitamin D  deficiency     Past Surgical History:  Procedure Laterality Date   ENDOMETRIAL ABLATION  12/08/2006   HER OPTION   LAPAROSCOPIC VAGINAL HYSTERECTOMY WITH SALPINGECTOMY Bilateral 11/28/2017   Procedure: LAPAROSCOPIC ASSISTED VAGINAL HYSTERECTOMY WITH SALPINGECTOMY AND LEFT OOPHERECTOMY;  Surgeon: Lavoie, Marie-Lyne, MD;  Location: [facility] [place];  Service: Gynecology;  Laterality: Bilateral;   MOUTH SURGERY      Medications Ordered Prior to Encounter[1]  Allergies[2]    PE Today's Vitals   02/07/24 1428  BP: (!) 142/80  Pulse: 89  Temp: 98 F (36.7 C)  TempSrc: Oral  SpO2: 97%  Weight: 196 lb (88.9 kg)   Body mass  index is 33.64 kg/m.  Physical Exam Genitourinary:       Comments: Patchy areas along vagina with acetowhite change Cervix absent (no other way to depict vaginal findings)     Colposcopy Procedure Consented for procedure.  Time out performed. Speculum placed in vagina.  Acetic acid 3% was applied to vagina. Satisfactory colposcopy.  Hurricaine anesthetic spray was applied. Biopsies taken Yes.   Location(s) - 7:00: right vaginal wall with acetowhite change, 3:00 left vaginal wall with acetowhite change   Findings: Patchy areas along vagina with acetowhite change  Specimens to pathology separately.  Monsel's applied to biopsy areas.   Good hemostasis.  Minimal EBL. No complications.  Tolerated moderate discomfort due to speculum placement and GSM.    Assessment and Plan:        Pre-procedure lab exam -     Pregnancy, urine  Abnormal Pap smear of vagina -     Colposcopy -     Surgical pathology  HPV in female -     Colposcopy -     Surgical pathology  VAIN I (vaginal intraepithelial neoplasia grade I) -     Surgical pathology  Will f/u biopsy results  Vera LULLA Pa, MD     [  1]  Current Outpatient Medications on File Prior to Visit  Medication Sig Dispense Refill   cetirizine (ZYRTEC) 10 MG tablet Take 10 mg by mouth daily.     estradiol  (ESTRACE ) 0.01 % CREA vaginal cream Apply 0.5g to vulva nightly for 2 weeks then 2 times a week. Do not use applicator. 42.5 g 1   MAGNESIUM PO Take by mouth.     metFORMIN (GLUCOPHAGE) 500 MG tablet Take 500 mg by mouth daily.     metroNIDAZOLE  (METROCREAM ) 0.75 % cream SMARTSIG:Sparingly Topical Twice Daily     POTASSIUM PO Take by mouth.     VITAMIN D  PO Take by mouth.     No current facility-administered medications on file prior to visit.  [2]  Allergies Allergen Reactions   Other Nausea And Vomiting    Steroid pills Had burning in stomach   "

## 2024-02-07 NOTE — Patient Instructions (Signed)
 It is common to have vaginal bleeding and cramping for up to 72 hours after your biopsy. Please call our office with heavy vaginal bleeding, severe abdominal pain or fever. Avoid intercourse, tampon use, douching and baths for 7 days to decrease the risk of infection.

## 2024-02-09 LAB — SURGICAL PATHOLOGY

## 2024-02-13 ENCOUNTER — Ambulatory Visit: Payer: Self-pay | Admitting: Obstetrics and Gynecology

## 2025-02-17 ENCOUNTER — Ambulatory Visit: Payer: Self-pay | Admitting: Obstetrics and Gynecology
